# Patient Record
Sex: Female | Born: 1955 | Marital: Single | Smoking: Never smoker
Health system: Southern US, Community
[De-identification: ages and names within clinical notes are randomized; demographics above are authoritative.]

## PROBLEM LIST (undated history)

## (undated) DIAGNOSIS — I10 Essential (primary) hypertension: Secondary | ICD-10-CM

## (undated) DIAGNOSIS — F419 Anxiety disorder, unspecified: Secondary | ICD-10-CM

## (undated) DIAGNOSIS — G43909 Migraine, unspecified, not intractable, without status migrainosus: Secondary | ICD-10-CM

## (undated) DIAGNOSIS — F410 Panic disorder [episodic paroxysmal anxiety] without agoraphobia: Secondary | ICD-10-CM

## (undated) DIAGNOSIS — K851 Biliary acute pancreatitis without necrosis or infection: Secondary | ICD-10-CM

## (undated) DIAGNOSIS — T7840XA Allergy, unspecified, initial encounter: Secondary | ICD-10-CM

## (undated) DIAGNOSIS — K8020 Calculus of gallbladder without cholecystitis without obstruction: Secondary | ICD-10-CM

## (undated) HISTORY — DX: Calculus of gallbladder without cholecystitis without obstruction: K80.20

## (undated) HISTORY — PX: MYRINGOTOMY WITH TUBE PLACEMENT: SHX5663

## (undated) HISTORY — DX: Anxiety disorder, unspecified: F41.9

## (undated) HISTORY — DX: Allergy, unspecified, initial encounter: T78.40XA

## (undated) HISTORY — DX: Biliary acute pancreatitis without necrosis or infection: K85.10

---

## 2007-06-24 ENCOUNTER — Ambulatory Visit: Payer: Self-pay | Admitting: Internal Medicine

## 2007-06-24 DIAGNOSIS — R002 Palpitations: Secondary | ICD-10-CM

## 2007-06-24 DIAGNOSIS — R209 Unspecified disturbances of skin sensation: Secondary | ICD-10-CM | POA: Insufficient documentation

## 2007-06-24 DIAGNOSIS — F4322 Adjustment disorder with anxiety: Secondary | ICD-10-CM

## 2007-06-24 DIAGNOSIS — R609 Edema, unspecified: Secondary | ICD-10-CM

## 2007-06-24 DIAGNOSIS — I83893 Varicose veins of bilateral lower extremities with other complications: Secondary | ICD-10-CM

## 2007-06-25 ENCOUNTER — Ambulatory Visit: Payer: Self-pay | Admitting: Internal Medicine

## 2007-06-25 LAB — CONVERTED CEMR LAB
Blood in Urine, dipstick: NEGATIVE
Nitrite: NEGATIVE
Protein, U semiquant: NEGATIVE
Vit D, 1,25-Dihydroxy: 20 — ABNORMAL LOW (ref 30–89)
WBC Urine, dipstick: NEGATIVE

## 2007-06-26 DIAGNOSIS — J309 Allergic rhinitis, unspecified: Secondary | ICD-10-CM | POA: Insufficient documentation

## 2007-06-30 ENCOUNTER — Telehealth: Payer: Self-pay | Admitting: *Deleted

## 2007-07-01 LAB — CONVERTED CEMR LAB
ALT: 20 units/L (ref 0–35)
AST: 16 units/L (ref 0–37)
Alkaline Phosphatase: 67 units/L (ref 39–117)
Basophils Absolute: 0 10*3/uL (ref 0.0–0.1)
Basophils Relative: 0.5 % (ref 0.0–1.0)
CO2: 34 meq/L — ABNORMAL HIGH (ref 19–32)
Chloride: 106 meq/L (ref 96–112)
Creatinine, Ser: 0.8 mg/dL (ref 0.4–1.2)
Direct LDL: 155.5 mg/dL
Eosinophils Relative: 3 % (ref 0.0–5.0)
HDL: 41.9 mg/dL (ref >39.0)
Lymphocytes Relative: 26.8 % (ref 12.0–46.0)
MCHC: 32.9 g/dL (ref 30.0–36.0)
Neutrophils Relative %: 59.3 % (ref 43.0–77.0)
Platelets: 304 10*3/uL (ref 150–400)
Potassium: 3.8 meq/L (ref 3.5–5.1)
RBC: 5.06 M/uL (ref 3.87–5.11)
T3, Free: 3.2 pg/mL (ref 2.3–4.2)
TSH: 2.14 microintl units/mL (ref 0.35–5.50)
Total Bilirubin: 0.9 mg/dL (ref 0.3–1.2)
VLDL: 23 mg/dL (ref 0–40)
WBC: 4.9 10*3/uL (ref 4.5–10.5)

## 2007-07-16 ENCOUNTER — Ambulatory Visit: Payer: Self-pay | Admitting: Internal Medicine

## 2007-07-16 DIAGNOSIS — E559 Vitamin D deficiency, unspecified: Secondary | ICD-10-CM | POA: Insufficient documentation

## 2007-07-23 ENCOUNTER — Encounter: Payer: Self-pay | Admitting: Internal Medicine

## 2007-08-22 ENCOUNTER — Ambulatory Visit: Payer: Self-pay | Admitting: Internal Medicine

## 2007-08-22 DIAGNOSIS — L309 Dermatitis, unspecified: Secondary | ICD-10-CM | POA: Insufficient documentation

## 2013-09-22 ENCOUNTER — Inpatient Hospital Stay (HOSPITAL_COMMUNITY)
Admission: EM | Admit: 2013-09-22 | Discharge: 2013-09-28 | DRG: 417 | Disposition: A | Discharge status: Home / Self Care | Payer: 59 | Attending: Internal Medicine | Admitting: Internal Medicine

## 2013-09-22 ENCOUNTER — Emergency Department (HOSPITAL_COMMUNITY): Payer: 59

## 2013-09-22 ENCOUNTER — Encounter (HOSPITAL_COMMUNITY): Payer: Self-pay | Admitting: Emergency Medicine

## 2013-09-22 DIAGNOSIS — L503 Dermatographic urticaria: Secondary | ICD-10-CM | POA: Diagnosis present

## 2013-09-22 DIAGNOSIS — Z6838 Body mass index (BMI) 38.0-38.9, adult: Secondary | ICD-10-CM

## 2013-09-22 DIAGNOSIS — K8020 Calculus of gallbladder without cholecystitis without obstruction: Secondary | ICD-10-CM

## 2013-09-22 DIAGNOSIS — K851 Biliary acute pancreatitis without necrosis or infection: Secondary | ICD-10-CM

## 2013-09-22 DIAGNOSIS — K429 Umbilical hernia without obstruction or gangrene: Secondary | ICD-10-CM | POA: Diagnosis present

## 2013-09-22 DIAGNOSIS — Z79899 Other long term (current) drug therapy: Secondary | ICD-10-CM

## 2013-09-22 DIAGNOSIS — K8010 Calculus of gallbladder with chronic cholecystitis without obstruction: Secondary | ICD-10-CM | POA: Diagnosis present

## 2013-09-22 DIAGNOSIS — I10 Essential (primary) hypertension: Secondary | ICD-10-CM | POA: Diagnosis present

## 2013-09-22 DIAGNOSIS — K859 Acute pancreatitis without necrosis or infection, unspecified: Secondary | ICD-10-CM | POA: Diagnosis present

## 2013-09-22 DIAGNOSIS — Z9109 Other allergy status, other than to drugs and biological substances: Secondary | ICD-10-CM | POA: Diagnosis not present

## 2013-09-22 DIAGNOSIS — Z791 Long term (current) use of non-steroidal anti-inflammatories (NSAID): Secondary | ICD-10-CM | POA: Diagnosis not present

## 2013-09-22 DIAGNOSIS — R1013 Epigastric pain: Secondary | ICD-10-CM | POA: Diagnosis present

## 2013-09-22 HISTORY — DX: Biliary acute pancreatitis without necrosis or infection: K85.10

## 2013-09-22 HISTORY — DX: Migraine, unspecified, not intractable, without status migrainosus: G43.909

## 2013-09-22 HISTORY — DX: Calculus of gallbladder without cholecystitis without obstruction: K80.20

## 2013-09-22 LAB — COMPREHENSIVE METABOLIC PANEL
ALT: 187 U/L — ABNORMAL HIGH (ref 0–35)
ANION GAP: 13 (ref 5–15)
AST: 209 U/L — ABNORMAL HIGH (ref 0–37)
Albumin: 3.9 g/dL (ref 3.5–5.2)
Alkaline Phosphatase: 92 U/L (ref 39–117)
BUN: 16 mg/dL (ref 6–23)
CO2: 26 mEq/L (ref 19–32)
CREATININE: 0.84 mg/dL (ref 0.50–1.10)
Calcium: 9.7 mg/dL (ref 8.4–10.5)
Chloride: 101 mEq/L (ref 96–112)
GFR calc Af Amer: 86 mL/min — ABNORMAL LOW (ref >90)
GFR calc non Af Amer: 75 mL/min — ABNORMAL LOW (ref >90)
GLUCOSE: 146 mg/dL — AB (ref 70–99)
Potassium: 4.5 mEq/L (ref 3.7–5.3)
Sodium: 140 mEq/L (ref 137–147)
TOTAL PROTEIN: 7.6 g/dL (ref 6.0–8.3)
Total Bilirubin: 1.3 mg/dL — ABNORMAL HIGH (ref 0.3–1.2)

## 2013-09-22 LAB — CBC WITH DIFFERENTIAL/PLATELET
BASOS PCT: 0 % (ref 0–1)
Basophils Absolute: 0 10*3/uL (ref 0.0–0.1)
EOS ABS: 0 10*3/uL (ref 0.0–0.7)
EOS PCT: 0 % (ref 0–5)
HCT: 46.9 % — ABNORMAL HIGH (ref 36.0–46.0)
HEMOGLOBIN: 16.2 g/dL — AB (ref 12.0–15.0)
LYMPHS ABS: 0.5 10*3/uL — AB (ref 0.7–4.0)
Lymphocytes Relative: 5 % — ABNORMAL LOW (ref 12–46)
MCH: 30 pg (ref 26.0–34.0)
MCHC: 34.5 g/dL (ref 30.0–36.0)
MCV: 86.9 fL (ref 78.0–100.0)
MONO ABS: 0.5 10*3/uL (ref 0.1–1.0)
Monocytes Relative: 5 % (ref 3–12)
Neutro Abs: 8.4 10*3/uL — ABNORMAL HIGH (ref 1.7–7.7)
Neutrophils Relative %: 90 % — ABNORMAL HIGH (ref 43–77)
Platelets: 272 10*3/uL (ref 150–400)
RBC: 5.4 MIL/uL — AB (ref 3.87–5.11)
RDW: 13.1 % (ref 11.5–15.5)
WBC: 9.4 10*3/uL (ref 4.0–10.5)

## 2013-09-22 LAB — I-STAT TROPONIN, ED: Troponin i, poc: 0 ng/mL (ref 0.00–0.08)

## 2013-09-22 LAB — LIPASE, BLOOD

## 2013-09-22 MED ORDER — SODIUM CHLORIDE 0.9 % IV BOLUS (SEPSIS)
1000.0000 mL | Freq: Once | INTRAVENOUS | Status: AC
  Administered 2013-09-22: 1000 mL via INTRAVENOUS

## 2013-09-22 MED ORDER — KETOROLAC TROMETHAMINE 30 MG/ML IJ SOLN
30.0000 mg | Freq: Once | INTRAMUSCULAR | Status: AC
  Administered 2013-09-22: 30 mg via INTRAVENOUS
  Filled 2013-09-22: qty 1

## 2013-09-22 MED ORDER — MORPHINE SULFATE 4 MG/ML IJ SOLN: 4.0000 mg | INTRAMUSCULAR | Status: DC | PRN

## 2013-09-22 MED ORDER — HYDROMORPHONE HCL PF 1 MG/ML IJ SOLN: 1.0000 mg | INTRAMUSCULAR | Status: AC | PRN

## 2013-09-22 MED ORDER — ONDANSETRON HCL 4 MG/2ML IJ SOLN
4.0000 mg | Freq: Once | INTRAMUSCULAR | Status: AC
  Administered 2013-09-22: 4 mg via INTRAVENOUS
  Filled 2013-09-22: qty 2

## 2013-09-22 NOTE — ED Notes (Signed)
Pt ambulated to the restroom with stand by assistance.

## 2013-09-22 NOTE — ED Notes (Signed)
Pt presents from home, c/o of RUQ abdominal pain 8/10, episode of emesis, states made her feel a little better, denies fevers chills or diarrhea.

## 2013-09-22 NOTE — ED Provider Notes (Signed)
Medical screening examination/treatment/procedure(s) were conducted as a shared visit with non-physician practitioner(s) and myself.  I personally evaluated the patient during the encounter.   EKG Interpretation None      I have performed a physical exam and history on the patient including cardiac, pulmonary, and gi system exam which were significant for epigastric abd tenderness.  Ct scan with gallstones, pancreatitis, lipase >3000.  Pt was admitted.    Mirian MoMatthew Gentry, MD 09/22/13 [phone]

## 2013-09-22 NOTE — ED Provider Notes (Signed)
CSN: [id number]     Arrival date & time 09/22/13  1547 History   First MD Initiated Contact with Patient 09/22/13 1651     Chief Complaint  Patient presents with  . Abdominal Pain  . Emesis     (Consider location/radiation/quality/duration/timing/severity/associated sxs/prior Treatment) Patient is a 58 y.o. female presenting with abdominal pain and vomiting.  Abdominal Pain Associated symptoms: vomiting   Emesis Associated symptoms: abdominal pain    This is a 58 y.o. F with PMH sigificant for migraines, presenting to the ED for abdominal pain, onset last night.  Patient states pain is localized to the epigastrium, described as constant and sharp without radiation. She endorses nausea and vomiting.  States this has been an intermittent issue over the past month, seems worse when eating fatty or greasy foods. She does admit to eating chocolate covered peanuts and [employer] earlier today. Denies any chest pain or shortness of breath. No prior abdominal surgeries. No fever or chills. No urinary symptoms or vaginal complaints. BM normal, twice today.  No melena or hematochezia.  Freely passing flatus.  Patient did take Pepcid prior to arrival without improvement of symptoms.  Denies EtOH.  Past Medical History  Diagnosis Date  . Migraine    History reviewed. No pertinent past surgical history. History reviewed. No pertinent family history. History  Substance Use Topics  . Smoking status: Never Smoker   . Smokeless tobacco: Never Used  . Alcohol Use: No   OB History   Grav Para Term Preterm Abortions TAB SAB Ect Mult Living                 Review of Systems  Gastrointestinal: Positive for vomiting and abdominal pain.      Allergies  Adhesive and Iodine  Home Medications   Prior to Admission medications   Medication Sig Start Date End Date Taking? Authorizing Provider  FAMOTIDINE PO Take 1 tablet by mouth daily as needed (reflux.).   Yes Historical Provider, MD  ibuprofen  (ADVIL,MOTRIN) 200 MG tablet Take 600 mg by mouth every 6 (six) hours as needed (pain.).   Yes Historical Provider, MD   BP 190/90  Pulse 62  Temp(Src) 98.1 F (36.7 C) (Oral)  Resp 20  SpO2 100%  Physical Exam  Nursing note and vitals reviewed. Constitutional: She is oriented to person, place, and time. She appears well-developed and well-nourished. No distress.  HENT:  Head: Normocephalic and atraumatic.  Mouth/Throat: Oropharynx is clear and moist.  Eyes: Conjunctivae and EOM are normal. Pupils are equal, round, and reactive to light.  Neck: Normal range of motion. Neck supple.  Cardiovascular: Normal rate, regular rhythm and normal heart sounds.   Pulmonary/Chest: Effort normal and breath sounds normal. No respiratory distress. She has no wheezes.  Abdominal: Soft. Bowel sounds are normal. There is tenderness in the right upper quadrant and epigastric area. There is no guarding, no CVA tenderness, no tenderness at McBurney's point and negative Murphy's sign.  Abdomen soft, non-distended, tenderness of epigastrium and RUQ without peritoneal signs; negative Murphy's sign  Musculoskeletal: Normal range of motion. She exhibits no edema.  Neurological: She is alert and oriented to person, place, and time.  Skin: Skin is warm and dry. She is not diaphoretic.  Psychiatric: She has a normal mood and affect.    ED Course  Procedures (including critical care time) Labs Review Labs Reviewed  CBC WITH DIFFERENTIAL - Abnormal; Notable for the following:    RBC 5.40 (*)    Hemoglobin  16.2 (*)    HCT 46.9 (*)    Neutrophils Relative % 90 (*)    Neutro Abs 8.4 (*)    Lymphocytes Relative 5 (*)    Lymphs Abs 0.5 (*)    All other components within normal limits  COMPREHENSIVE METABOLIC PANEL - Abnormal; Notable for the following:    Glucose, Bld 146 (*)    ALT 187 (*)    Total Bilirubin 1.3 (*)    GFR calc non Af Amer 75 (*)    GFR calc Af Amer 86 (*)    All other components within  normal limits  LIPASE, BLOOD  I-STAT TROPOININ, ED    Imaging Review Ct Abdomen Pelvis Wo Contrast  09/22/2013   CLINICAL DATA:  Mid abdominal pain, nausea and vomiting. Elevated LFTs.  EXAM: CT ABDOMEN AND PELVIS WITHOUT CONTRAST  TECHNIQUE: Multidetector CT imaging of the abdomen and pelvis was performed following the standard protocol without IV contrast.  COMPARISON:  Abdominal ultrasound performed earlier today at 6:27 p.m.  FINDINGS: The visualized lung bases are clear.  There is mild diffuse soft tissue inflammation about the pancreas, extending about the second segment of the duodenum, with trace free fluid tracking inferiorly along Gerota's fascia bilaterally. Minimal inflammation involves the adjacent gallbladder, though the process appears centered about the pancreas. A 5.0 cm stone is noted within the gallbladder, and there appears to be a somewhat high density Phrygian cap.  Findings are suspicious for acute pancreatitis. There is slightly decreased attenuation involving the proximal body of the pancreas, which could reflect mild devascularization. No pseudocyst formation is seen at this time. The liver and spleen are unremarkable in appearance. The gallbladder is within normal limits. The pancreas and adrenal glands are unremarkable.  Nonspecific perinephric stranding is noted bilaterally. The kidneys are otherwise unremarkable in appearance. The kidneys are unremarkable in appearance. There is no evidence of hydronephrosis. No renal or ureteral stones are seen.  No free fluid is identified. The small bowel is unremarkable in appearance. The stomach is within normal limits. No acute vascular abnormalities are seen. Minimal calcification is seen along the abdominal aorta and its branches.  A tiny umbilical hernia is noted, containing only fat.  The appendix is normal in caliber, without evidence for appendicitis. Minimal diverticulosis is noted along the descending and sigmoid colon, without  evidence of diverticulitis.  The bladder is mildly distended and grossly unremarkable. The uterus is within normal limits. The ovaries are relatively symmetric. No suspicious adnexal masses are seen. No inguinal lymphadenopathy is seen.  No acute osseous abnormalities are identified.  IMPRESSION: 1. Acute pancreatitis noted, with mild diffuse soft tissue inflammation about the pancreas, extending about the second segment of the duodenum, and trace free fluid tracking inferiorly along the anterior aspect of Gerota's fascia on both sides. Slightly decreased attenuation at the proximal body of the pancreas could reflect mild devascularization. No evidence of pseudocyst formation. 2. Minimal inflammation involves the adjacent gallbladder, though the process appears centered about the pancreas. 5.0 cm gallstone noted. 3. Tiny umbilical hernia, containing only fat. 4. Minimal diverticulosis along the descending and sigmoid colon, without evidence of diverticulitis.   Electronically Signed   By: Roanna RaiderJeffery  Chang M.D.   On: 09/22/2013 22:50   [place] Abdomen Complete  09/22/2013   CLINICAL DATA:  Nausea, vomiting, and abdominal pain  EXAM: ULTRASOUND ABDOMEN COMPLETE  COMPARISON:  None.  FINDINGS: Gallbladder:  The gallbladder is adequately distended but the lumen is occupied by multiple stones exhibiting a  wall echo shadow contour. There is mild gallbladder wall thickening to 4.2 mm. There are stones in the gallbladder neck. There is no positive sonographic Murphy's sign.  Common bile duct:  Diameter:  4.3 mm.  Liver:  The liver demonstrates mildly increased echotexture consistent with fatty infiltration. There is no focal mass or ductal dilation.  IVC:  No abnormality visualized.  Pancreas:  The pancreas was obscured by bowel gas.  Spleen:  Size and appearance within normal limits.  Right Kidney:  Length: 11.4 cm. Echogenicity within normal limits. No mass or hydronephrosis visualized.  Left Kidney:  Length: 12.2 cm.  Echogenicity within normal limits. No mass or hydronephrosis visualized.  Abdominal aorta:  Evaluation of the abdominal aorta is limited due to bowel gas and the patient's body habitus.  Other findings:  No ascites is demonstrated.  IMPRESSION: 1. There are multiple gallstones with some impacted in the gallbladder neck. There is mild gallbladder wall thickening but no positive sonographic Murphy's sign. 2. The common bile duct is normal. 3. There are likely fatty infiltrated changes of the liver. 4. The pancreas was obscured by bowel gas.   Electronically Signed   By: David  [place]   On: 09/22/2013 20:03     EKG Interpretation None      MDM   Final diagnoses:  Acute gallstone pancreatitis   58 year old female with epigastric abdominal pain. She is afebrile and nontoxic-appearing, focal tenderness of epigastrium and RUQ without peritoneal signs. She has no prior history of abdominal surgeries.  Lab work was obtained revealing elevated LFTs and bilirubin. Her lipase is greater than 3000.  U/s obtained, multiple gallstones noted but unable to fully visualize pancreas.  CT obtained and is consistent with acute pancreatitis without abscess or pseudocysts. Multiple gallstones again seen, no evidence of choledocholithiasis at present.  Will admit to hospitalist service for further management-- Dr. Welton Flakes, Laureen Abrahams.  Case discussed with general surgery who will see patient in consult.  Garlon Hatchet, PA-C 09/23/13 [phone]

## 2013-09-22 NOTE — H&P (Signed)
[facility] History and Physical  Tiffany Lane ZOX:[id number] DOB: 1955/02/27 DOA: 09/22/2013  Referring physician: Sharilyn Sites, PA-C PCP: Lorretta Harp, MD   Chief Complaint: Abdominal Pain  HPI: Tiffany Lane is a 58 y.o. female presents with abdominal pain. Patient states tha she has noted the pain for about 3 days now. She has had indigestion and epigastric pain. Patient states that she had no fevers noted. She states pain does not appear to radiate anywhere else. She initially tried to take some of her famotidine but this did not seem to help. She states that she has never had any pancreatic issues in the past. Patient states that she does not drink and does not smoke. She did have diarrhea but no blood in her stools.   Review of Systems:  Constitutional:  No weight loss, night sweats, Fevers, chills, fatigue.  HEENT:  No headaches, Difficulty swallowing,Tooth/dental problems,Sore throat,  No sneezing, itching, ear ache, nasal congestion, post nasal drip,  Cardio-vascular:  No chest pain, Orthopnea, PND, swelling in lower extremities, anasarca, dizziness, palpitations  GI:  ++heartburn, ++indigestion, ++abdominal pain, ++nausea, ++vomiting, ++diarrhea Resp:  No shortness of breath with exertion or at rest. No excess mucus, no productive cough, No non-productive cough, No coughing up of blood  Skin:  no rash or lesions.  GU:  no dysuria, change in color of urine, no urgency or frequency.  Musculoskeletal:  No joint pain or swelling. No decreased range of motion.  Psych:  No change in mood or affect. No depression or anxiety.  Past Medical History  Diagnosis Date  . Migraine    History reviewed. No pertinent past surgical history. Social History:  reports that she has never smoked. She has never used smokeless tobacco. She reports that she does not drink alcohol or use illicit drugs.  Allergies  Allergen Reactions  . Adhesive [Tape] Hives  . Iodine Hives     History reviewed. No pertinent family history.   Prior to Admission medications   Medication Sig Start Date End Date Taking? Authorizing Provider  FAMOTIDINE PO Take 1 tablet by mouth daily as needed (reflux.).   Yes Historical Provider, MD  ibuprofen (ADVIL,MOTRIN) 200 MG tablet Take 600 mg by mouth every 6 (six) hours as needed (pain.).   Yes Historical Provider, MD   Physical Exam: Filed Vitals:   09/22/13 1558 09/22/13 1851 09/22/13 2111 09/22/13 2332  BP: 190/90 151/68 153/76 170/81  Pulse: 62 68 82 93  Temp: 98.1 F (36.7 C)     TempSrc: Oral     Resp: 20 16 20 16   SpO2: 100% 96% 95% 96%    Wt Readings from Last 3 Encounters:  08/22/07 101.152 kg (223 lb)  07/16/07 101.606 kg (224 lb)  06/24/07 103.42 kg (228 lb)    General:  Appears calm and comfortable Eyes: PERRL, normal lids, irises & conjunctiva ENT: grossly normal hearing, lips & tongue Neck: no LAD, masses or thyromegaly Cardiovascular: RRR, no m/r/g. No LE edema. Respiratory: CTA bilaterally, no w/r/r. Normal respiratory effort. Abdomen: soft, ++epigastric tenderness no rebound negative murphys Skin: no rash or induration seen on limited exam Musculoskeletal: grossly normal tone BUE/BLE Psychiatric: grossly normal mood and affect, speech fluent and appropriate Neurologic: grossly non-focal.          Labs on Admission:  Basic Metabolic Panel:  Recent Labs Lab 09/22/13 1726  NA 140  K 4.5  CL 101  CO2 26  GLUCOSE 146*  BUN 16  CREATININE 0.84  CALCIUM 9.7   Liver Function Tests:  Recent Labs Lab 09/22/13 1726  AST 209*  ALT 187*  ALKPHOS 92  BILITOT 1.3*  PROT 7.6  ALBUMIN 3.9    Recent Labs Lab 09/22/13 1726  LIPASE >3000*   No results found for this basename: AMMONIA,  in the last 168 hours CBC:  Recent Labs Lab 09/22/13 1726  WBC 9.4  NEUTROABS 8.4*  HGB 16.2*  HCT 46.9*  MCV 86.9  PLT 272   Cardiac Enzymes: No results found for this basename: CKTOTAL, CKMB,  CKMBINDEX, TROPONINI,  in the last 168 hours  BNP (last 3 results) No results found for this basename: PROBNP,  in the last 8760 hours CBG: No results found for this basename: GLUCAP,  in the last 168 hours  Radiological Exams on Admission: Ct Abdomen Pelvis Wo Contrast  09/22/2013   CLINICAL DATA:  Mid abdominal pain, nausea and vomiting. Elevated LFTs.  EXAM: CT ABDOMEN AND PELVIS WITHOUT CONTRAST  TECHNIQUE: Multidetector CT imaging of the abdomen and pelvis was performed following the standard protocol without IV contrast.  COMPARISON:  Abdominal ultrasound performed earlier today at 6:27 p.m.  FINDINGS: The visualized lung bases are clear.  There is mild diffuse soft tissue inflammation about the pancreas, extending about the second segment of the duodenum, with trace free fluid tracking inferiorly along Gerota's fascia bilaterally. Minimal inflammation involves the adjacent gallbladder, though the process appears centered about the pancreas. A 5.0 cm stone is noted within the gallbladder, and there appears to be a somewhat high density Phrygian cap.  Findings are suspicious for acute pancreatitis. There is slightly decreased attenuation involving the proximal body of the pancreas, which could reflect mild devascularization. No pseudocyst formation is seen at this time. The liver and spleen are unremarkable in appearance. The gallbladder is within normal limits. The pancreas and adrenal glands are unremarkable.  Nonspecific perinephric stranding is noted bilaterally. The kidneys are otherwise unremarkable in appearance. The kidneys are unremarkable in appearance. There is no evidence of hydronephrosis. No renal or ureteral stones are seen.  No free fluid is identified. The small bowel is unremarkable in appearance. The stomach is within normal limits. No acute vascular abnormalities are seen. Minimal calcification is seen along the abdominal aorta and its branches.  A tiny umbilical hernia is noted,  containing only fat.  The appendix is normal in caliber, without evidence for appendicitis. Minimal diverticulosis is noted along the descending and sigmoid colon, without evidence of diverticulitis.  The bladder is mildly distended and grossly unremarkable. The uterus is within normal limits. The ovaries are relatively symmetric. No suspicious adnexal masses are seen. No inguinal lymphadenopathy is seen.  No acute osseous abnormalities are identified.  IMPRESSION: 1. Acute pancreatitis noted, with mild diffuse soft tissue inflammation about the pancreas, extending about the second segment of the duodenum, and trace free fluid tracking inferiorly along the anterior aspect of Gerota's fascia on both sides. Slightly decreased attenuation at the proximal body of the pancreas could reflect mild devascularization. No evidence of pseudocyst formation. 2. Minimal inflammation involves the adjacent gallbladder, though the process appears centered about the pancreas. 5.0 cm gallstone noted. 3. Tiny umbilical hernia, containing only fat. 4. Minimal diverticulosis along the descending and sigmoid colon, without evidence of diverticulitis.   Electronically Signed   By: Roanna Raider M.D.   On: 09/22/2013 22:50   US Abdomen Complete  09/22/2013   CLINICAL DATA:  Nausea, vomiting, and abdominal pain  EXAM: ULTRASOUND ABDOMEN COMPLETE  COMPARISON:  None.  FINDINGS: Gallbladder:  The gallbladder is adequately distended but the lumen is occupied by multiple stones exhibiting a wall echo shadow contour. There is mild gallbladder wall thickening to 4.2 mm. There are stones in the gallbladder neck. There is no positive sonographic Murphy's sign.  Common bile duct:  Diameter:  4.3 mm.  Liver:  The liver demonstrates mildly increased echotexture consistent with fatty infiltration. There is no focal mass or ductal dilation.  IVC:  No abnormality visualized.  Pancreas:  The pancreas was obscured by bowel gas.  Spleen:  Size and  appearance within normal limits.  Right Kidney:  Length: 11.4 cm. Echogenicity within normal limits. No mass or hydronephrosis visualized.  Left Kidney:  Length: 12.2 cm. Echogenicity within normal limits. No mass or hydronephrosis visualized.  Abdominal aorta:  Evaluation of the abdominal aorta is limited due to bowel gas and the patient's body habitus.  Other findings:  No ascites is demonstrated.  IMPRESSION: 1. There are multiple gallstones with some impacted in the gallbladder neck. There is mild gallbladder wall thickening but no positive sonographic Murphy's sign. 2. The common bile duct is normal. 3. There are likely fatty infiltrated changes of the liver. 4. The pancreas was obscured by bowel gas.   Electronically Signed   By: David  [place]   On: 09/22/2013 20:03     Assessment/Plan Active Problems:   Acute gallstone pancreatitis   Gallstones   1. Acute Pancreatitis -will rest her gut for now -started on IV fluids -pain control  2. Gallstones -surgery consultation -GI consultation    Code Status: Full Code (must indicate code status--if unknown or must be presumed, indicate so) DVT Prophylaxis:Heparin Family Communication: None (indicate person spoken with, if applicable, with phone number if by telephone) Disposition Plan: Home (indicate anticipated LOS)  Time spent: 60min  [facility] HospitalKHAN,SAADAT A [facility] Pager [phone]  **Disclaimer: This note may have been dictated with voice recognition software. Similar sounding words can inadvertently be transcribed and this note may contain transcription errors which may not have been corrected upon publication of note.**

## 2013-09-22 NOTE — ED Notes (Signed)
Bed: [facility] Expected date:  Expected time:  Means of arrival:  Comments: EMS-abdominal pain 

## 2013-09-23 ENCOUNTER — Encounter (HOSPITAL_COMMUNITY): Payer: Self-pay

## 2013-09-23 DIAGNOSIS — K8020 Calculus of gallbladder without cholecystitis without obstruction: Secondary | ICD-10-CM

## 2013-09-23 DIAGNOSIS — K859 Acute pancreatitis without necrosis or infection, unspecified: Secondary | ICD-10-CM

## 2013-09-23 LAB — LIPASE, BLOOD: Lipase: 2115 U/L — ABNORMAL HIGH (ref 11–59)

## 2013-09-23 LAB — COMPREHENSIVE METABOLIC PANEL
ALT: 130 U/L — ABNORMAL HIGH (ref 0–35)
AST: 85 U/L — ABNORMAL HIGH (ref 0–37)
Albumin: 3.3 g/dL — ABNORMAL LOW (ref 3.5–5.2)
Alkaline Phosphatase: 82 U/L (ref 39–117)
Anion gap: 11 (ref 5–15)
BUN: 14 mg/dL (ref 6–23)
CALCIUM: 8 mg/dL — AB (ref 8.4–10.5)
CHLORIDE: 106 meq/L (ref 96–112)
CO2: 24 mEq/L (ref 19–32)
Creatinine, Ser: 0.78 mg/dL (ref 0.50–1.10)
GFR, EST NON AFRICAN AMERICAN: 90 mL/min — AB (ref >90)
GLUCOSE: 110 mg/dL — AB (ref 70–99)
Potassium: 3.9 mEq/L (ref 3.7–5.3)
SODIUM: 141 meq/L (ref 137–147)
Total Bilirubin: 0.6 mg/dL (ref 0.3–1.2)
Total Protein: 6.4 g/dL (ref 6.0–8.3)

## 2013-09-23 LAB — LIPID PANEL
Cholesterol: 179 mg/dL (ref 0–200)
HDL: 50 mg/dL (ref >39)
LDL CALC: 113 mg/dL — AB (ref 0–99)
TRIGLYCERIDES: 79 mg/dL (ref <150)
Total CHOL/HDL Ratio: 3.6 RATIO
VLDL: 16 mg/dL (ref 0–40)

## 2013-09-23 LAB — CBC
HEMATOCRIT: 43.6 % (ref 36.0–46.0)
Hemoglobin: 14.8 g/dL (ref 12.0–15.0)
MCH: 29.6 pg (ref 26.0–34.0)
MCHC: 33.9 g/dL (ref 30.0–36.0)
MCV: 87.2 fL (ref 78.0–100.0)
PLATELETS: 255 10*3/uL (ref 150–400)
RBC: 5 MIL/uL (ref 3.87–5.11)
RDW: 13.4 % (ref 11.5–15.5)
WBC: 11.5 10*3/uL — ABNORMAL HIGH (ref 4.0–10.5)

## 2013-09-23 LAB — GLUCOSE, CAPILLARY: GLUCOSE-CAPILLARY: 106 mg/dL — AB (ref 70–99)

## 2013-09-23 LAB — TSH: TSH: 1.33 u[IU]/mL (ref 0.350–4.500)

## 2013-09-23 LAB — HEMOGLOBIN A1C
Hgb A1c MFr Bld: 5.8 % — ABNORMAL HIGH (ref <5.7)
Mean Plasma Glucose: 120 mg/dL — ABNORMAL HIGH (ref <117)

## 2013-09-23 MED ORDER — FAMOTIDINE 20 MG PO TABS
20.0000 mg | ORAL_TABLET | Freq: Every day | ORAL | Status: DC
  Administered 2013-09-23 – 2013-09-28 (×6): 20 mg via ORAL
  Filled 2013-09-23 (×6): qty 1

## 2013-09-23 MED ORDER — SODIUM CHLORIDE 0.9 % IV SOLN
INTRAVENOUS | Status: AC
  Administered 2013-09-23: 200 mL/h via INTRAVENOUS
  Administered 2013-09-23 (×2): via INTRAVENOUS

## 2013-09-23 MED ORDER — SODIUM CHLORIDE 0.9 % IV BOLUS (SEPSIS): 1000.0000 mL | INTRAVENOUS | Status: DC | PRN

## 2013-09-23 MED ORDER — MAGNESIUM CITRATE PO SOLN: 1.0000 | Freq: Once | ORAL | Status: AC | PRN

## 2013-09-23 MED ORDER — MORPHINE SULFATE 4 MG/ML IJ SOLN
2.0000 mg | INTRAMUSCULAR | Status: DC | PRN
  Administered 2013-09-23 – 2013-09-26 (×9): 2 mg via INTRAVENOUS
  Filled 2013-09-23 (×10): qty 1

## 2013-09-23 MED ORDER — ACETAMINOPHEN 325 MG PO TABS
650.0000 mg | ORAL_TABLET | Freq: Four times a day (QID) | ORAL | Status: DC | PRN
  Administered 2013-09-23 – 2013-09-26 (×3): 650 mg via ORAL
  Filled 2013-09-23 (×2): qty 2

## 2013-09-23 MED ORDER — BISACODYL 10 MG RE SUPP: 10.0000 mg | Freq: Every day | RECTAL | Status: DC | PRN

## 2013-09-23 MED ORDER — DOCUSATE SODIUM 100 MG PO CAPS
100.0000 mg | ORAL_CAPSULE | Freq: Two times a day (BID) | ORAL | Status: DC
  Administered 2013-09-23 – 2013-09-28 (×11): 100 mg via ORAL
  Filled 2013-09-23 (×12): qty 1

## 2013-09-23 MED ORDER — ALUM & MAG HYDROXIDE-SIMETH 200-200-20 MG/5ML PO SUSP: 30.0000 mL | Freq: Four times a day (QID) | ORAL | Status: DC | PRN

## 2013-09-23 MED ORDER — HYDRALAZINE HCL 20 MG/ML IJ SOLN: 10.0000 mg | Freq: Four times a day (QID) | INTRAMUSCULAR | Status: DC | PRN

## 2013-09-23 MED ORDER — ONDANSETRON HCL 4 MG/2ML IJ SOLN
4.0000 mg | Freq: Four times a day (QID) | INTRAMUSCULAR | Status: DC | PRN
  Administered 2013-09-23 – 2013-09-27 (×4): 4 mg via INTRAVENOUS
  Filled 2013-09-23 (×3): qty 2

## 2013-09-23 MED ORDER — ACETAMINOPHEN 650 MG RE SUPP: 650.0000 mg | Freq: Four times a day (QID) | RECTAL | Status: DC | PRN

## 2013-09-23 MED ORDER — PROMETHAZINE HCL 25 MG/ML IJ SOLN
12.5000 mg | Freq: Four times a day (QID) | INTRAMUSCULAR | Status: DC | PRN
  Filled 2013-09-23: qty 1

## 2013-09-23 MED ORDER — ONDANSETRON HCL 4 MG PO TABS: 4.0000 mg | ORAL_TABLET | Freq: Four times a day (QID) | ORAL | Status: DC | PRN

## 2013-09-23 MED ORDER — POTASSIUM CHLORIDE 2 MEQ/ML IV SOLN
INTRAVENOUS | Status: DC
  Administered 2013-09-23 – 2013-09-26 (×6): via INTRAVENOUS
  Filled 2013-09-23 (×14): qty 1000

## 2013-09-23 MED ORDER — HEPARIN SODIUM (PORCINE) 5000 UNIT/ML IJ SOLN
5000.0000 [IU] | Freq: Three times a day (TID) | INTRAMUSCULAR | Status: DC
  Administered 2013-09-23 – 2013-09-25 (×6): 5000 [IU] via SUBCUTANEOUS
  Filled 2013-09-23 (×10): qty 1

## 2013-09-23 NOTE — Progress Notes (Signed)
Patient Demographics  Tiffany Lane, is a 58 y.o. female, DOB - 02-27-1955, UEA:[id number]  Admit date - 09/22/2013   Admitting Physician Yevonne Pax, MD  Outpatient Primary MD for the patient is Lorretta Harp, MD  LOS - 1   Chief Complaint  Patient presents with  . Abdominal Pain  . Emesis        Subjective:   Tiffany Lane today has, No headache, No chest pain, positive epigastric abdominal pain - No Nausea, No new weakness tingling or numbness, No Cough - SOB.     Assessment & Plan    1. Acute gallstone pancreatitis - Gen. surgery following, improved clinically, continue n.p.o., IV fluids, supportive care, discussed with general surgery in detail no need for MRCP right now. Fasting lipid panel stable. Likely cholecystectomy in the next 2-3 days if pancreatitis resolves as expected.      Code Status: Full  Family Communication: Mother bedside  Disposition Plan: Home   Procedures CT scan abdomen pelvis, right upper quadrant ultrasound   Consults [facility]   Medications  Scheduled Meds: . docusate sodium  100 mg Oral BID  . famotidine  20 mg Oral Daily  . heparin  5,000 Units Subcutaneous 3 times per day   Continuous Infusions: . sodium chloride 200 mL/hr at 09/23/13 1030  . [START ON 09/24/2013] dextrose 5 % and 0.45% NaCl 1,000 mL with potassium chloride 20 mEq infusion     PRN Meds:.acetaminophen, alum & mag hydroxide-simeth, bisacodyl, magnesium citrate, morphine, ondansetron (ZOFRAN) IV, promethazine, sodium chloride  DVT Prophylaxis  Heparin  Lab Results  Component Value Date   PLT 255 09/23/2013    Antibiotics     Anti-infectives   None          Objective:   Filed Vitals:   09/22/13 2111 09/22/13 2332 09/23/13 0100 09/23/13 0522  BP: 153/76 170/81  161/84 155/82  Pulse: 82 93 76 82  Temp:   98.6 F (37 C) 98.9 F (37.2 C)  TempSrc:   Oral Oral  Resp: 20 16 18 18   Weight:    105.235 kg (232 lb)  SpO2: 95% 96% 96% 93%    Wt Readings from Last 3 Encounters:  09/23/13 105.235 kg (232 lb)  08/22/07 101.152 kg (223 lb)  07/16/07 101.606 kg (224 lb)     Intake/Output Summary (Last 24 hours) at 09/23/13 1140 Last data filed at 09/23/13 0526  Gross per 24 hour  Intake      0 ml  Output    400 ml  Net   -400 ml     Physical Exam  Awake Alert, Oriented X 3, No new F.N deficits, Normal affect [place].AT,PERRAL Supple Neck,No JVD, No cervical lymphadenopathy appriciated.  Symmetrical Chest wall movement, Good air movement bilaterally, CTAB RRR,No Gallops,Rubs or new Murmurs, No Parasternal Heave +ve B.Sounds, Abd Soft, positive epigastric tenderness, No organomegaly appriciated, No rebound - guarding or rigidity. No Cyanosis, Clubbing or edema, No new Rash or bruise      Data Review   Micro Results No results found for this or any previous visit (from the past 240 hour(s)).  Radiology Reports Ct Abdomen Pelvis Wo Contrast  09/22/2013   CLINICAL DATA:  Mid abdominal pain, nausea and vomiting.  Elevated LFTs.  EXAM: CT ABDOMEN AND PELVIS WITHOUT CONTRAST  TECHNIQUE: Multidetector CT imaging of the abdomen and pelvis was performed following the standard protocol without IV contrast.  COMPARISON:  Abdominal ultrasound performed earlier today at 6:27 p.m.  FINDINGS: The visualized lung bases are clear.  There is mild diffuse soft tissue inflammation about the pancreas, extending about the second segment of the duodenum, with trace free fluid tracking inferiorly along Gerota's fascia bilaterally. Minimal inflammation involves the adjacent gallbladder, though the process appears centered about the pancreas. A 5.0 cm stone is noted within the gallbladder, and there appears to be a somewhat high density Phrygian cap.  Findings are suspicious  for acute pancreatitis. There is slightly decreased attenuation involving the proximal body of the pancreas, which could reflect mild devascularization. No pseudocyst formation is seen at this time. The liver and spleen are unremarkable in appearance. The gallbladder is within normal limits. The pancreas and adrenal glands are unremarkable.  Nonspecific perinephric stranding is noted bilaterally. The kidneys are otherwise unremarkable in appearance. The kidneys are unremarkable in appearance. There is no evidence of hydronephrosis. No renal or ureteral stones are seen.  No free fluid is identified. The small bowel is unremarkable in appearance. The stomach is within normal limits. No acute vascular abnormalities are seen. Minimal calcification is seen along the abdominal aorta and its branches.  A tiny umbilical hernia is noted, containing only fat.  The appendix is normal in caliber, without evidence for appendicitis. Minimal diverticulosis is noted along the descending and sigmoid colon, without evidence of diverticulitis.  The bladder is mildly distended and grossly unremarkable. The uterus is within normal limits. The ovaries are relatively symmetric. No suspicious adnexal masses are seen. No inguinal lymphadenopathy is seen.  No acute osseous abnormalities are identified.  IMPRESSION: 1. Acute pancreatitis noted, with mild diffuse soft tissue inflammation about the pancreas, extending about the second segment of the duodenum, and trace free fluid tracking inferiorly along the anterior aspect of Gerota's fascia on both sides. Slightly decreased attenuation at the proximal body of the pancreas could reflect mild devascularization. No evidence of pseudocyst formation. 2. Minimal inflammation involves the adjacent gallbladder, though the process appears centered about the pancreas. 5.0 cm gallstone noted. 3. Tiny umbilical hernia, containing only fat. 4. Minimal diverticulosis along the descending and sigmoid  colon, without evidence of diverticulitis.   Electronically Signed   By: Roanna RaiderJeffery  Chang M.D.   On: 09/22/2013 22:50   [place] Abdomen Complete  09/22/2013   CLINICAL DATA:  Nausea, vomiting, and abdominal pain  EXAM: ULTRASOUND ABDOMEN COMPLETE  COMPARISON:  None.  FINDINGS: Gallbladder:  The gallbladder is adequately distended but the lumen is occupied by multiple stones exhibiting a wall echo shadow contour. There is mild gallbladder wall thickening to 4.2 mm. There are stones in the gallbladder neck. There is no positive sonographic Murphy's sign.  Common bile duct:  Diameter:  4.3 mm.  Liver:  The liver demonstrates mildly increased echotexture consistent with fatty infiltration. There is no focal mass or ductal dilation.  IVC:  No abnormality visualized.  Pancreas:  The pancreas was obscured by bowel gas.  Spleen:  Size and appearance within normal limits.  Right Kidney:  Length: 11.4 cm. Echogenicity within normal limits. No mass or hydronephrosis visualized.  Left Kidney:  Length: 12.2 cm. Echogenicity within normal limits. No mass or hydronephrosis visualized.  Abdominal aorta:  Evaluation of the abdominal aorta is limited due to bowel gas and the patient's body habitus.  Other findings:  No ascites is demonstrated.  IMPRESSION: 1. There are multiple gallstones with some impacted in the gallbladder neck. There is mild gallbladder wall thickening but no positive sonographic Murphy's sign. 2. The common bile duct is normal. 3. There are likely fatty infiltrated changes of the liver. 4. The pancreas was obscured by bowel gas.   Electronically Signed   By: David  [place]   On: 09/22/2013 20:03    CBC  Recent Labs Lab 09/22/13 1726 09/23/13 0515  WBC 9.4 11.5*  HGB 16.2* 14.8  HCT 46.9* 43.6  PLT 272 255  MCV 86.9 87.2  MCH 30.0 29.6  MCHC 34.5 33.9  RDW 13.1 13.4  LYMPHSABS 0.5*  --   MONOABS 0.5  --   EOSABS 0.0  --   BASOSABS 0.0  --     Chemistries   Recent Labs Lab 09/22/13 1726  09/23/13 0515  NA 140 141  K 4.5 3.9  CL 101 106  CO2 26 24  GLUCOSE 146* 110*  BUN 16 14  CREATININE 0.84 0.78  CALCIUM 9.7 8.0*  AST 209* 85*  ALT 187* 130*  ALKPHOS 92 82  BILITOT 1.3* 0.6   ------------------------------------------------------------------------------------------------------------------ CrCl is unknown because both a height and weight (above a minimum accepted value) are required for this calculation. ------------------------------------------------------------------------------------------------------------------ No results found for this basename: HGBA1C,  in the last 72 hours ------------------------------------------------------------------------------------------------------------------  Recent Labs  09/23/13 0515  CHOL 179  HDL 50  LDLCALC 113*  TRIG 79  CHOLHDL 3.6   ------------------------------------------------------------------------------------------------------------------  Recent Labs  09/23/13 0515  TSH 1.330   ------------------------------------------------------------------------------------------------------------------ No results found for this basename: VITAMINB12, FOLATE, FERRITIN, TIBC, IRON, RETICCTPCT,  in the last 72 hours  Coagulation profile No results found for this basename: INR, PROTIME,  in the last 168 hours  No results found for this basename: DDIMER,  in the last 72 hours  Cardiac Enzymes No results found for this basename: CK, CKMB, TROPONINI, MYOGLOBIN,  in the last 168 hours ------------------------------------------------------------------------------------------------------------------ No components found with this basename: POCBNP,      Time Spent in minutes  35   Susa Raring K M.D on 09/23/2013 at 11:40 AM  Between 7am to 7pm - Pager - [phone]  After 7pm go to [email or web address] - password [email or web address]  And look for the night coverage person covering for me after hours  [facility] Office  [phone]   **Disclaimer: This note may have been dictated with voice recognition software. Similar sounding words can inadvertently be transcribed and this note may contain transcription errors which may not have been corrected upon publication of note.**

## 2013-09-23 NOTE — Consult Note (Signed)
Tiffany Lane October 25, 1955  [id number].   Requesting MD: Dr. 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 She was at a patient's house yesterday and began having epigastric abdominal pain. She had a bowel movement. She that this may help her pain however her pain persisted. Her pain then became excruciating in the epigastrium radiating across her upper abdomen. She then developed nausea and vomiting. She admits to some chills but no fevers. Due to persistent pain that was not improving, she presented to [facility] emergency department. She was found to have an elevated lipase greater than 3000 along with elevated liver function tests. She had an ultrasound of the abdomen which revealed a 5 cm gallstone lodged in the neck of the gallbladder. She then had a CT scan that revealed pancreatitis. The patient was admitted to the hospital and we were asked to evaluate the patient for further recommendations.  ROS please see history of present illness otherwise all other systems have been reviewed and are negative except for currently having a headache from her narcotic pain medicine. 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 Other Mother   . Other Father     Past Medical History  Diagnosis Date  . Migraine     Past Surgical History  Procedure Laterality Date  . Myringotomy with tube placement      Social History:  reports that she has never smoked. She has never used smokeless tobacco. She reports that she does not drink alcohol or use illicit drugs.  Allergies:  Allergies  Allergen Reactions  . Adhesive [Tape] Hives  . Blistex [Phenol]     Blisters lip  . Iodine Hives    Medications Prior to Admission  Medication Sig Dispense Refill   . FAMOTIDINE PO Take 1 tablet by mouth daily as needed (reflux.).      Marland Kitchen ibuprofen (ADVIL,MOTRIN) 200 MG tablet Take 600 mg by mouth every 6 (six) hours as needed (pain.).        Blood pressure 155/82, pulse 82, temperature 98.9 F (37.2 C), temperature source Oral, resp. rate 18, weight 232 lb (105.235 kg), SpO2 93.00%. Physical Exam: General: pleasant, obese white female who is laying in bed in NAD HEENT: head is normocephalic, atraumatic.  Sclera are noninjected.  PERRL.  Ears and nose without any masses or lesions.  Mouth is pink and moist Heart: regular, rate, and rhythm.  Normal s1,s2. No obvious murmurs, gallops, or rubs noted.  Palpable radial and pedal pulses bilaterally Lungs: CTAB, no wheezes, rhonchi, or rales noted.  Respiratory effort nonlabored Abd: soft, tender throughout upper abdomen, but focally more tender in her epigastrium and left upper quadrant. She did have some voluntary guarding with palpation in this area. ND, but obese, +BS, no masses, hernias, or organomegaly MS: all 4 extremities are symmetrical with no cyanosis, clubbing, or edema. Skin: warm and dry with no masses, lesions, or rashes Psych: A&Ox3 with an appropriate affect.    Results for orders placed during the hospital encounter of 09/22/13 (from the past 48 hour(s))  CBC WITH DIFFERENTIAL     Status: Abnormal   Collection Time    09/22/13  5:26 PM      Result Value Ref Range   WBC 9.4  4.0 - 10.5 K/uL  RBC 5.40 (*) 3.87 - 5.11 MIL/uL   Hemoglobin 16.2 (*) 12.0 - 15.0 g/dL   HCT 46.9 (*) 36.0 - 46.0 %   MCV 86.9  78.0 - 100.0 fL   MCH 30.0  26.0 - 34.0 pg   MCHC 34.5  30.0 - 36.0 g/dL   RDW 13.1  11.5 - 15.5 %   Platelets 272  150 - 400 K/uL   Neutrophils Relative % 90 (*) 43 - 77 %   Neutro Abs 8.4 (*) 1.7 - 7.7 K/uL   Lymphocytes Relative 5 (*) 12 - 46 %   Lymphs Abs 0.5 (*) 0.7 - 4.0 K/uL   Monocytes Relative 5  3 - 12 %   Monocytes Absolute 0.5  0.1 - 1.0 K/uL   Eosinophils Relative 0   0 - 5 %   Eosinophils Absolute 0.0  0.0 - 0.7 K/uL   Basophils Relative 0  0 - 1 %   Basophils Absolute 0.0  0.0 - 0.1 K/uL  COMPREHENSIVE METABOLIC PANEL     Status: Abnormal   Collection Time    09/22/13  5:26 PM      Result Value Ref Range   Sodium 140  137 - 147 mEq/L   Potassium 4.5  3.7 - 5.3 mEq/L   Chloride 101  96 - 112 mEq/L   CO2 26  19 - 32 mEq/L   Glucose, Bld 146 (*) 70 - 99 mg/dL   BUN 16  6 - 23 mg/dL   Creatinine, Ser 0.84  0.50 - 1.10 mg/dL   Calcium 9.7  8.4 - 10.5 mg/dL   Total Protein 7.6  6.0 - 8.3 g/dL   Albumin 3.9  3.5 - 5.2 g/dL   AST 209 (*) 0 - 37 U/L   Comment: SLIGHT HEMOLYSIS     HEMOLYSIS AT THIS LEVEL MAY AFFECT RESULT   ALT 187 (*) 0 - 35 U/L   Alkaline Phosphatase 92  39 - 117 U/L   Total Bilirubin 1.3 (*) 0.3 - 1.2 mg/dL   GFR calc non Af Amer 75 (*) >90 mL/min   GFR calc Af Amer 86 (*) >90 mL/min   Comment: (NOTE)     The eGFR has been calculated using the CKD EPI equation.     This calculation has not been validated in all clinical situations.     eGFR's persistently <90 mL/min signify possible Chronic Kidney     Disease.   Anion gap 13  5 - 15  LIPASE, BLOOD     Status: Abnormal   Collection Time    09/22/13  5:26 PM      Result Value Ref Range   Lipase >3000 (*) 11 - 59 U/L  I-STAT TROPOININ, ED     Status: None   Collection Time    09/22/13  5:35 PM      Result Value Ref Range   Troponin i, poc 0.00  0.00 - 0.08 ng/mL   Comment 3            Comment: Due to the release kinetics of cTnI,     a negative result within the first hours     of the onset of symptoms does not rule out     myocardial infarction with certainty.     If myocardial infarction is still suspected,     repeat the test at appropriate intervals.  CBC     Status: Abnormal   Collection Time    09/23/13  5:15  AM      Result Value Ref Range   WBC 11.5 (*) 4.0 - 10.5 K/uL   RBC 5.00  3.87 - 5.11 MIL/uL   Hemoglobin 14.8  12.0 - 15.0 g/dL   HCT 43.6  36.0 - 46.0  %   MCV 87.2  78.0 - 100.0 fL   MCH 29.6  26.0 - 34.0 pg   MCHC 33.9  30.0 - 36.0 g/dL   RDW 13.4  11.5 - 15.5 %   Platelets 255  150 - 400 K/uL  TSH     Status: None   Collection Time    09/23/13  5:15 AM      Result Value Ref Range   TSH 1.330  0.350 - 4.500 uIU/mL   Comment: Performed at [facility] PANEL     Status: Abnormal   Collection Time    09/23/13  5:15 AM      Result Value Ref Range   Sodium 141  137 - 147 mEq/L   Potassium 3.9  3.7 - 5.3 mEq/L   Chloride 106  96 - 112 mEq/L   CO2 24  19 - 32 mEq/L   Glucose, Bld 110 (*) 70 - 99 mg/dL   BUN 14  6 - 23 mg/dL   Creatinine, Ser 0.78  0.50 - 1.10 mg/dL   Calcium 8.0 (*) 8.4 - 10.5 mg/dL   Total Protein 6.4  6.0 - 8.3 g/dL   Albumin 3.3 (*) 3.5 - 5.2 g/dL   AST 85 (*) 0 - 37 U/L   ALT 130 (*) 0 - 35 U/L   Alkaline Phosphatase 82  39 - 117 U/L   Total Bilirubin 0.6  0.3 - 1.2 mg/dL   GFR calc non Af Amer 90 (*) >90 mL/min   GFR calc Af Amer >90  >90 mL/min   Comment: (NOTE)     The eGFR has been calculated using the CKD EPI equation.     This calculation has not been validated in all clinical situations.     eGFR's persistently <90 mL/min signify possible Chronic Kidney     Disease.   Anion gap 11  5 - 15  LIPID PANEL     Status: Abnormal   Collection Time    09/23/13  5:15 AM      Result Value Ref Range   Cholesterol 179  0 - 200 mg/dL   Triglycerides 79  <150 mg/dL   HDL 50  >39 mg/dL   Total CHOL/HDL Ratio 3.6     VLDL 16  0 - 40 mg/dL   LDL Cholesterol 113 (*) 0 - 99 mg/dL   Comment:            Total Cholesterol/HDL:CHD Risk     Coronary Heart Disease Risk Table                         Men   Women      1/2 Average Risk   3.4   3.3      Average Risk       5.0   4.4      2 X Average Risk   9.6   7.1      3 X Average Risk  23.4   11.0                Use the calculated Patient Ratio     above and the CHD Risk Table  to determine the patient's CHD Risk.                 ATP III CLASSIFICATION (LDL):      <100     mg/dL   Optimal      100-129  mg/dL   Near or Above                        Optimal      130-159  mg/dL   Borderline      160-189  mg/dL   High      >190     mg/dL   Very High     Performed at [facility] Abdomen Pelvis Wo Contrast  09/22/2013   CLINICAL DATA:  Mid abdominal pain, nausea and vomiting. Elevated LFTs.  EXAM: CT ABDOMEN AND PELVIS WITHOUT CONTRAST  TECHNIQUE: Multidetector CT imaging of the abdomen and pelvis was performed following the standard protocol without IV contrast.  COMPARISON:  Abdominal ultrasound performed earlier today at 6:27 p.m.  FINDINGS: The visualized lung bases are clear.  There is mild diffuse soft tissue inflammation about the pancreas, extending about the second segment of the duodenum, with trace free fluid tracking inferiorly along Gerota's fascia bilaterally. Minimal inflammation involves the adjacent gallbladder, though the process appears centered about the pancreas. A 5.0 cm stone is noted within the gallbladder, and there appears to be a somewhat high density Phrygian cap.  Findings are suspicious for acute pancreatitis. There is slightly decreased attenuation involving the proximal body of the pancreas, which could reflect mild devascularization. No pseudocyst formation is seen at this time. The liver and spleen are unremarkable in appearance. The gallbladder is within normal limits. The pancreas and adrenal glands are unremarkable.  Nonspecific perinephric stranding is noted bilaterally. The kidneys are otherwise unremarkable in appearance. The kidneys are unremarkable in appearance. There is no evidence of hydronephrosis. No renal or ureteral stones are seen.  No free fluid is identified. The small bowel is unremarkable in appearance. The stomach is within normal limits. No acute vascular abnormalities are seen. Minimal calcification is seen along the abdominal aorta and its branches.  A tiny umbilical  hernia is noted, containing only fat.  The appendix is normal in caliber, without evidence for appendicitis. Minimal diverticulosis is noted along the descending and sigmoid colon, without evidence of diverticulitis.  The bladder is mildly distended and grossly unremarkable. The uterus is within normal limits. The ovaries are relatively symmetric. No suspicious adnexal masses are seen. No inguinal lymphadenopathy is seen.  No acute osseous abnormalities are identified.  IMPRESSION: 1. Acute pancreatitis noted, with mild diffuse soft tissue inflammation about the pancreas, extending about the second segment of the duodenum, and trace free fluid tracking inferiorly along the anterior aspect of Gerota's fascia on both sides. Slightly decreased attenuation at the proximal body of the pancreas could reflect mild devascularization. No evidence of pseudocyst formation. 2. Minimal inflammation involves the adjacent gallbladder, though the process appears centered about the pancreas. 5.0 cm gallstone noted. 3. Tiny umbilical hernia, containing only fat. 4. Minimal diverticulosis along the descending and sigmoid colon, without evidence of diverticulitis.   Electronically Signed   By: Garald Balding M.D.   On: 09/22/2013 22:50   US Abdomen Complete  09/22/2013   CLINICAL DATA:  Nausea, vomiting, and abdominal pain  EXAM: ULTRASOUND ABDOMEN COMPLETE  COMPARISON:  None.  FINDINGS: Gallbladder:  The gallbladder is adequately distended but the  lumen is occupied by multiple stones exhibiting a wall echo shadow contour. There is mild gallbladder wall thickening to 4.2 mm. There are stones in the gallbladder neck. There is no positive sonographic Murphy's sign.  Common bile duct:  Diameter:  4.3 mm.  Liver:  The liver demonstrates mildly increased echotexture consistent with fatty infiltration. There is no focal mass or ductal dilation.  IVC:  No abnormality visualized.  Pancreas:  The pancreas was obscured by bowel gas.  Spleen:   Size and appearance within normal limits.  Right Kidney:  Length: 11.4 cm. Echogenicity within normal limits. No mass or hydronephrosis visualized.  Left Kidney:  Length: 12.2 cm. Echogenicity within normal limits. No mass or hydronephrosis visualized.  Abdominal aorta:  Evaluation of the abdominal aorta is limited due to bowel gas and the patient's body habitus.  Other findings:  No ascites is demonstrated.  IMPRESSION: 1. There are multiple gallstones with some impacted in the gallbladder neck. There is mild gallbladder wall thickening but no positive sonographic Murphy's sign. 2. The common bile duct is normal. 3. There are likely fatty infiltrated changes of the liver. 4. The pancreas was obscured by bowel gas.   Electronically Signed   By: Allanah Mcfarland  [place]   On: 09/22/2013 20:03       Assessment/Plan 1. Gallstone pancreatitis 2. Improving liver function tests  Plan: 1. The patient should continue on n.p.o. status. I will actually increased her IV fluids to 200 cc an hour as this has been shown to help improve pancreatitis faster. This usually helps for the first 24 hours. Therefore I will decrease her IV fluids back down to 125 cc an hour after 12 midnight tonight. 2. Recheck her liver function tests and her lipase in the morning. We will not pursue a cholecystectomy until the patient has clinically resolved her pancreatitis. I have discussed this with the patient. She understands and is agreeable to this plan. We'll continue to follow along with you.  Varden,KELLY E 09/23/2013, 10:06 AM Pager: [phone]  Agree with above. Agree with increased IVF volume.  Clinical course will dictate next steps.  Alphonsa Overall, MD, [facility] Pager: [phone] Office phone:  [phone]

## 2013-09-24 LAB — COMPREHENSIVE METABOLIC PANEL
ALK PHOS: 76 U/L (ref 39–117)
ALT: 74 U/L — AB (ref 0–35)
ANION GAP: 10 (ref 5–15)
AST: 34 U/L (ref 0–37)
Albumin: 2.9 g/dL — ABNORMAL LOW (ref 3.5–5.2)
BILIRUBIN TOTAL: 0.6 mg/dL (ref 0.3–1.2)
BUN: 14 mg/dL (ref 6–23)
CO2: 26 mEq/L (ref 19–32)
Calcium: 7.5 mg/dL — ABNORMAL LOW (ref 8.4–10.5)
Chloride: 106 mEq/L (ref 96–112)
Creatinine, Ser: 0.81 mg/dL (ref 0.50–1.10)
GFR calc Af Amer: >90 mL/min (ref >90)
GFR calc non Af Amer: 78 mL/min — ABNORMAL LOW (ref >90)
Glucose, Bld: 108 mg/dL — ABNORMAL HIGH (ref 70–99)
POTASSIUM: 3.6 meq/L — AB (ref 3.7–5.3)
Sodium: 142 mEq/L (ref 137–147)
TOTAL PROTEIN: 6.2 g/dL (ref 6.0–8.3)

## 2013-09-24 LAB — CBC
HCT: 41.3 % (ref 36.0–46.0)
Hemoglobin: 13.5 g/dL (ref 12.0–15.0)
MCH: 29.3 pg (ref 26.0–34.0)
MCHC: 32.7 g/dL (ref 30.0–36.0)
MCV: 89.8 fL (ref 78.0–100.0)
PLATELETS: 225 10*3/uL (ref 150–400)
RBC: 4.6 MIL/uL (ref 3.87–5.11)
RDW: 13.8 % (ref 11.5–15.5)
WBC: 12.2 10*3/uL — AB (ref 4.0–10.5)

## 2013-09-24 LAB — GLUCOSE, CAPILLARY: GLUCOSE-CAPILLARY: 81 mg/dL (ref 70–99)

## 2013-09-24 LAB — LIPASE, BLOOD: Lipase: 823 U/L — ABNORMAL HIGH (ref 11–59)

## 2013-09-24 MED ORDER — DIPHENHYDRAMINE HCL 25 MG PO CAPS
25.0000 mg | ORAL_CAPSULE | Freq: Four times a day (QID) | ORAL | Status: DC | PRN
  Administered 2013-09-24 – 2013-09-28 (×8): 25 mg via ORAL
  Filled 2013-09-24 (×10): qty 1

## 2013-09-24 MED ORDER — METRONIDAZOLE IN NACL 5-0.79 MG/ML-% IV SOLN
500.0000 mg | Freq: Three times a day (TID) | INTRAVENOUS | Status: DC
Start: 2013-09-24 — End: 2013-09-24
  Administered 2013-09-24 (×2): 500 mg via INTRAVENOUS
  Filled 2013-09-24 (×2): qty 100

## 2013-09-24 MED ORDER — SODIUM CHLORIDE 0.9 % IV SOLN
3.0000 g | Freq: Four times a day (QID) | INTRAVENOUS | Status: DC
  Administered 2013-09-24 – 2013-09-25 (×4): 3 g via INTRAVENOUS
  Filled 2013-09-24 (×6): qty 3

## 2013-09-24 MED ORDER — CIPROFLOXACIN IN D5W 400 MG/200ML IV SOLN
400.0000 mg | Freq: Two times a day (BID) | INTRAVENOUS | Status: DC
  Filled 2013-09-24: qty 200

## 2013-09-24 MED ORDER — DEXTROSE 5 % IV SOLN
2.0000 g | INTRAVENOUS | Status: DC
  Administered 2013-09-24: 2 g via INTRAVENOUS
  Filled 2013-09-24: qty 2

## 2013-09-24 NOTE — Progress Notes (Signed)
Notified MD Thedore MinsSingh that patient is experiencing rash from adhesives over her body that are very itchy, order given for benadryl 25 mg po every 6 hours as needed for itching, order entered Stanford BreedBracey, Varvara Legault N RN 09-24-2013 15:54pm

## 2013-09-24 NOTE — Progress Notes (Addendum)
Patient ID: Tiffany Lane, female   DOB: Jul 09, 1955, 58 y.o.   MRN: [id number]    Subjective: Patient feels better but still having some pain. She is hungry.  Objective: Vital signs in last 24 hours: Temp:  [98.6 F (37 C)-99.7 F (37.6 C)] 98.9 F (37.2 C) (08/13 0600) Pulse Rate:  [76-86] 81 (08/13 0600) Resp:  [18] 18 (08/13 0600) BP: (123-140)/(69-75) 137/75 mmHg (08/13 0600) SpO2:  [88 %-95 %] 94 % (08/13 0600) Last BM Date: 09/22/13  Intake/Output from previous day: 08/12 0701 - 08/13 0700 In: 3612.1 [P.O.:60; I.V.:3552.1] Out: 1150 [Urine:1150] Intake/Output this shift:    PE: Abd: Soft, still tender in her epigastrium and left upper quadrant but less than yesterday. Mild right upper quadrant tenderness. Active bowel sounds, nondistended, obese Heart: Regular Lungs: Clear to auscultation bilaterally  Lab Results:   Recent Labs  09/23/13 0515 09/24/13 0505  WBC 11.5* 12.2*  HGB 14.8 13.5  HCT 43.6 41.3  PLT 255 225   BMET  Recent Labs  09/23/13 0515 09/24/13 0505  NA 141 142  K 3.9 3.6*  CL 106 106  CO2 24 26  GLUCOSE 110* 108*  BUN 14 14  CREATININE 0.78 0.81  CALCIUM 8.0* 7.5*   PT/INR No results found for this basename: LABPROT, INR,  in the last 72 hours CMP     Component Value Date/Time   NA 142 09/24/2013 0505   K 3.6* 09/24/2013 0505   CL 106 09/24/2013 0505   CO2 26 09/24/2013 0505   GLUCOSE 108* 09/24/2013 0505   BUN 14 09/24/2013 0505   CREATININE 0.81 09/24/2013 0505   CALCIUM 7.5* 09/24/2013 0505   PROT 6.2 09/24/2013 0505   ALBUMIN 2.9* 09/24/2013 0505   AST 34 09/24/2013 0505   ALT 74* 09/24/2013 0505   ALKPHOS 76 09/24/2013 0505   BILITOT 0.6 09/24/2013 0505   GFRNONAA 78* 09/24/2013 0505   GFRAA >90 09/24/2013 0505   Lipase     Component Value Date/Time   LIPASE 823* 09/24/2013 0505    Studies/Results: Ct Abdomen Pelvis Wo Contrast  09/22/2013   CLINICAL DATA:  Mid abdominal pain, nausea and vomiting. Elevated LFTs.  EXAM: CT  ABDOMEN AND PELVIS WITHOUT CONTRAST  TECHNIQUE: Multidetector CT imaging of the abdomen and pelvis was performed following the standard protocol without IV contrast.  COMPARISON:  Abdominal ultrasound performed earlier today at 6:27 p.m.  FINDINGS: The visualized lung bases are clear.  There is mild diffuse soft tissue inflammation about the pancreas, extending about the second segment of the duodenum, with trace free fluid tracking inferiorly along Gerota's fascia bilaterally. Minimal inflammation involves the adjacent gallbladder, though the process appears centered about the pancreas. A 5.0 cm stone is noted within the gallbladder, and there appears to be a somewhat high density Phrygian cap.  Findings are suspicious for acute pancreatitis. There is slightly decreased attenuation involving the proximal body of the pancreas, which could reflect mild devascularization. No pseudocyst formation is seen at this time. The liver and spleen are unremarkable in appearance. The gallbladder is within normal limits. The pancreas and adrenal glands are unremarkable.  Nonspecific perinephric stranding is noted bilaterally. The kidneys are otherwise unremarkable in appearance. The kidneys are unremarkable in appearance. There is no evidence of hydronephrosis. No renal or ureteral stones are seen.  No free fluid is identified. The small bowel is unremarkable in appearance. The stomach is within normal limits. No acute vascular abnormalities are seen. Minimal calcification is seen along  the abdominal aorta and its branches.  A tiny umbilical hernia is noted, containing only fat.  The appendix is normal in caliber, without evidence for appendicitis. Minimal diverticulosis is noted along the descending and sigmoid colon, without evidence of diverticulitis.  The bladder is mildly distended and grossly unremarkable. The uterus is within normal limits. The ovaries are relatively symmetric. No suspicious adnexal masses are seen. No  inguinal lymphadenopathy is seen.  No acute osseous abnormalities are identified.  IMPRESSION: 1. Acute pancreatitis noted, with mild diffuse soft tissue inflammation about the pancreas, extending about the second segment of the duodenum, and trace free fluid tracking inferiorly along the anterior aspect of Gerota's fascia on both sides. Slightly decreased attenuation at the proximal body of the pancreas could reflect mild devascularization. No evidence of pseudocyst formation. 2. Minimal inflammation involves the adjacent gallbladder, though the process appears centered about the pancreas. 5.0 cm gallstone noted. 3. Tiny umbilical hernia, containing only fat. 4. Minimal diverticulosis along the descending and sigmoid colon, without evidence of diverticulitis.   Electronically Signed   By: Roanna RaiderJeffery  Chang M.D.   On: 09/22/2013 22:50   [place] Abdomen Complete  09/22/2013   CLINICAL DATA:  Nausea, vomiting, and abdominal pain  EXAM: ULTRASOUND ABDOMEN COMPLETE  COMPARISON:  None.  FINDINGS: Gallbladder:  The gallbladder is adequately distended but the lumen is occupied by multiple stones exhibiting a wall echo shadow contour. There is mild gallbladder wall thickening to 4.2 mm. There are stones in the gallbladder neck. There is no positive sonographic Murphy's sign.  Common bile duct:  Diameter:  4.3 mm.  Liver:  The liver demonstrates mildly increased echotexture consistent with fatty infiltration. There is no focal mass or ductal dilation.  IVC:  No abnormality visualized.  Pancreas:  The pancreas was obscured by bowel gas.  Spleen:  Size and appearance within normal limits.  Right Kidney:  Length: 11.4 cm. Echogenicity within normal limits. No mass or hydronephrosis visualized.  Left Kidney:  Length: 12.2 cm. Echogenicity within normal limits. No mass or hydronephrosis visualized.  Abdominal aorta:  Evaluation of the abdominal aorta is limited due to bowel gas and the patient's body habitus.  Other findings:  No  ascites is demonstrated.  IMPRESSION: 1. There are multiple gallstones with some impacted in the gallbladder neck. There is mild gallbladder wall thickening but no positive sonographic Murphy's sign. 2. The common bile duct is normal. 3. There are likely fatty infiltrated changes of the liver. 4. The pancreas was obscured by bowel gas.   Electronically Signed   By: Liliann File  [place]   On: 09/22/2013 20:03    Anti-infectives: Anti-infectives   Start     Dose/Rate Route Frequency Ordered Stop   09/24/13 1000  cefTRIAXone (ROCEPHIN) 2 g in dextrose 5 % 50 mL IVPB     2 g 100 mL/hr over 30 Minutes Intravenous Every 24 hours 09/24/13 0750     09/24/13 0800  metroNIDAZOLE (FLAGYL) IVPB 500 mg     500 mg 100 mL/hr over 60 Minutes Intravenous Every 8 hours 09/24/13 0743     09/24/13 0745  ciprofloxacin (CIPRO) IVPB 400 mg  Status:  Discontinued     400 mg 200 mL/hr over 60 Minutes Intravenous Every 12 hours 09/24/13 0711 09/24/13 0750       Assessment/Plan  1. Gallstone pancreatitis  Plan: 1. Continue to await pancreatitis to resolve. Once this resolves then we can proceed with surgical intervention. The patient's white blood cell count is mildly trending  up. This likely is secondary to an inflammatory response from her pancreatitis, but she did have some mild wall thickening of her gallbladder on her ultrasound. Therefore I started her on Rocephin. Follow labs in the morning.   LOS: 2 days    Ezzell,KELLY E 09/24/2013, 9:12 AM Pager: [phone]  Agree with above. Lipase coming down.  She is feeling better, though still sore. Her son is sleeping in the room.  Ovidio Kin, MD, [facility] Pager: [phone] Office phone:  [phone]

## 2013-09-24 NOTE — Progress Notes (Signed)
Pt c/o of itchy reddened area on R posterior upper arm, R lower abdomen, and R anterior thigh.  Reported that she had noticed some itching earlier and had scratched.  Now she noticed rash.  Pt's skin inspected by nurses Chassidy Layson and Staton and pt reported she had had adhesive (tape, electrodes) on these areas prior to arrival on 5W.  Pt was given adhesive remover and ice to apply.  Pt reported that she thought symptoms were arising from stress. Noted pt had been on phone frequently and had been making arrangements for the care of her 60 yo and 60 yo sons.   Will follow up.

## 2013-09-24 NOTE — Progress Notes (Signed)
ANTIBIOTIC CONSULT NOTE - INITIAL  Pharmacy Consult for Unasyn Indication: intrabdominal infection  Allergies  Allergen Reactions  . Adhesive [Tape] Hives  . Blistex [Phenol]     Blisters lip  . Iodine Hives  . Lip Balm [Lip Medex] Hives    Allergic to Carmex    Patient Measurements: Height: 5' 5.5" (166.4 cm) Weight: 232 lb (105.235 kg) IBW/kg (Calculated) : 58.15  Vital Signs: Temp: 98.9 F (37.2 C) (08/13 1352) Temp src: Oral (08/13 1352) BP: 134/72 mmHg (08/13 1352) Pulse Rate: 83 (08/13 1352) Intake/Output from previous day: 08/12 0701 - 08/13 0700 In: 3612.1 [P.O.:60; I.V.:3552.1] Out: 1150 [Urine:1150] Intake/Output from this shift: Total I/O In: 1000 [I.V.:850; IV Piggyback:150] Out: 750 [Urine:750]  Labs:  Recent Labs  09/22/13 1726 09/23/13 0515 09/24/13 0505  WBC 9.4 11.5* 12.2*  HGB 16.2* 14.8 13.5  PLT 272 255 225  CREATININE 0.84 0.78 0.81   Estimated Creatinine Clearance: 90.9 ml/min (by C-G formula based on Cr of 0.81). No results found for this basename: VANCOTROUGH, VANCOPEAK, VANCORANDOM, GENTTROUGH, GENTPEAK, GENTRANDOM, TOBRATROUGH, TOBRAPEAK, TOBRARND, AMIKACINPEAK, AMIKACINTROU, AMIKACIN,  in the last 72 hours   Microbiology: No results found for this or any previous visit (from the past 720 hour(s)).  Medical History: Past Medical History  Diagnosis Date  . Migraine     Medications:  Scheduled:  . docusate sodium  100 mg Oral BID  . famotidine  20 mg Oral Daily  . heparin  5,000 Units Subcutaneous 3 times per day   Infusions:  . dextrose 5 % and 0.45% NaCl 1,000 mL with potassium chloride 20 mEq infusion 125 mL/hr at 09/24/13 1000   Assessment: 58 yo female admitted with abdominal pain and emesis and found to have acute gallstone pancreatitis to start Unasyn per pharmacy dosing. Baseline labs: WBC 12.2, SCr 0.81 with est CrCl 91 ml/min and afebrile  Goal of Therapy:  treatment of infection  Plan:  Unasyn 3g IV q6 Will  sign off  Hessie KnowsJustin M Czarina Gingras, PharmD, BCPS Pager [phone] 09/24/2013 3:59 PM

## 2013-09-24 NOTE — Progress Notes (Signed)
Patient Demographics  Tiffany Lane, is a 58 y.o. female, DOB - 1955/04/18, JYN:[id number]  Admit date - 09/22/2013   Admitting Physician Yevonne Pax, MD  Outpatient Primary MD for the patient is Lorretta Harp, MD  LOS - 2   Chief Complaint  Patient presents with  . Abdominal Pain  . Emesis        Subjective:   Tiffany Lane today has, No headache, No chest pain, positive epigastric abdominal pain but much improved - No Nausea, No new weakness tingling or numbness, No Cough - SOB.     Assessment & Plan    1. Acute gallstone pancreatitis with gallbladder thickening - Gen. surgery following, improved clinically, continue n.p.o., IV fluids, supportive care, discussed with general surgery in detail no need for MRCP right now. Fasting lipid panel stable. Likely cholecystectomy in the next 2-3 days if pancreatitis resolves as expected. Has noted Rocephin added by general surgery or gallbladder thickening, have added Flagyl as well.     Code Status: Full  Family Communication: Mother bedside  Disposition Plan: Home   Procedures CT scan abdomen pelvis, right upper quadrant ultrasound   Consults [facility]   Medications  Scheduled Meds: . cefTRIAXone (ROCEPHIN)  IV  2 g Intravenous Q24H  . docusate sodium  100 mg Oral BID  . famotidine  20 mg Oral Daily  . heparin  5,000 Units Subcutaneous 3 times per day  . metronidazole  500 mg Intravenous Q8H   Continuous Infusions: . dextrose 5 % and 0.45% NaCl 1,000 mL with potassium chloride 20 mEq infusion 125 mL/hr at 09/24/13 0813   PRN Meds:.acetaminophen, alum & mag hydroxide-simeth, bisacodyl, hydrALAZINE, morphine, ondansetron (ZOFRAN) IV, promethazine, sodium chloride  DVT Prophylaxis  Heparin  Lab Results  Component Value Date   PLT  225 09/24/2013    Antibiotics     Anti-infectives   Start     Dose/Rate Route Frequency Ordered Stop   09/24/13 1000  cefTRIAXone (ROCEPHIN) 2 g in dextrose 5 % 50 mL IVPB     2 g 100 mL/hr over 30 Minutes Intravenous Every 24 hours 09/24/13 0750     09/24/13 0800  metroNIDAZOLE (FLAGYL) IVPB 500 mg     500 mg 100 mL/hr over 60 Minutes Intravenous Every 8 hours 09/24/13 0743     09/24/13 0745  ciprofloxacin (CIPRO) IVPB 400 mg  Status:  Discontinued     400 mg 200 mL/hr over 60 Minutes Intravenous Every 12 hours 09/24/13 0711 09/24/13 0750          Objective:   Filed Vitals:   09/23/13 1400 09/23/13 2138 09/23/13 2147 09/24/13 0600  BP: 140/72 123/69  137/75  Pulse: 80 86  81  Temp: 98.6 F (37 C) 99.7 F (37.6 C)  98.9 F (37.2 C)  TempSrc: Oral Oral  Oral  Resp: 18 18  18   Height:      Weight:      SpO2: 95% 88% 93% 94%    Wt Readings from Last 3 Encounters:  09/23/13 105.235 kg (232 lb)  08/22/07 101.152 kg (223 lb)  07/16/07 101.606 kg (224 lb)     Intake/Output Summary (Last 24 hours) at 09/24/13 1124 Last data filed at 09/24/13 1058  Gross  per 24 hour  Intake 3162.08 ml  Output   1650 ml  Net 1512.08 ml     Physical Exam  Awake Alert, Oriented X 3, No new F.N deficits, Normal affect Tiffany Lane.AT,PERRAL Supple Neck,No JVD, No cervical lymphadenopathy appriciated.  Symmetrical Chest wall movement, Good air movement bilaterally, CTAB RRR,No Gallops,Rubs or new Murmurs, No Parasternal Heave +ve B.Sounds, Abd Soft, positive epigastric tenderness, No organomegaly appriciated, No rebound - guarding or rigidity. No Cyanosis, Clubbing or edema, No new Rash or bruise      Data Review   Micro Results No results found for this or any previous visit (from the past 240 hour(s)).  Radiology Reports Ct Abdomen Pelvis Wo Contrast  09/22/2013   CLINICAL DATA:  Mid abdominal pain, nausea and vomiting. Elevated LFTs.  EXAM: CT ABDOMEN AND PELVIS WITHOUT CONTRAST   TECHNIQUE: Multidetector CT imaging of the abdomen and pelvis was performed following the standard protocol without IV contrast.  COMPARISON:  Abdominal ultrasound performed earlier today at 6:27 p.m.  FINDINGS: The visualized lung bases are clear.  There is mild diffuse soft tissue inflammation about the pancreas, extending about the second segment of the duodenum, with trace free fluid tracking inferiorly along Gerota's fascia bilaterally. Minimal inflammation involves the adjacent gallbladder, though the process appears centered about the pancreas. A 5.0 cm stone is noted within the gallbladder, and there appears to be a somewhat high density Phrygian cap.  Findings are suspicious for acute pancreatitis. There is slightly decreased attenuation involving the proximal body of the pancreas, which could reflect mild devascularization. No pseudocyst formation is seen at this time. The liver and spleen are unremarkable in appearance. The gallbladder is within normal limits. The pancreas and adrenal glands are unremarkable.  Nonspecific perinephric stranding is noted bilaterally. The kidneys are otherwise unremarkable in appearance. The kidneys are unremarkable in appearance. There is no evidence of hydronephrosis. No renal or ureteral stones are seen.  No free fluid is identified. The small bowel is unremarkable in appearance. The stomach is within normal limits. No acute vascular abnormalities are seen. Minimal calcification is seen along the abdominal aorta and its branches.  A tiny umbilical hernia is noted, containing only fat.  The appendix is normal in caliber, without evidence for appendicitis. Minimal diverticulosis is noted along the descending and sigmoid colon, without evidence of diverticulitis.  The bladder is mildly distended and grossly unremarkable. The uterus is within normal limits. The ovaries are relatively symmetric. No suspicious adnexal masses are seen. No inguinal lymphadenopathy is seen.  No  acute osseous abnormalities are identified.  IMPRESSION: 1. Acute pancreatitis noted, with mild diffuse soft tissue inflammation about the pancreas, extending about the second segment of the duodenum, and trace free fluid tracking inferiorly along the anterior aspect of Gerota's fascia on both sides. Slightly decreased attenuation at the proximal body of the pancreas could reflect mild devascularization. No evidence of pseudocyst formation. 2. Minimal inflammation involves the adjacent gallbladder, though the process appears centered about the pancreas. 5.0 cm gallstone noted. 3. Tiny umbilical hernia, containing only fat. 4. Minimal diverticulosis along the descending and sigmoid colon, without evidence of diverticulitis.   Electronically Signed   By: Roanna Raider M.D.   On: 09/22/2013 22:50   US Abdomen Complete  09/22/2013   CLINICAL DATA:  Nausea, vomiting, and abdominal pain  EXAM: ULTRASOUND ABDOMEN COMPLETE  COMPARISON:  None.  FINDINGS: Gallbladder:  The gallbladder is adequately distended but the lumen is occupied by multiple stones exhibiting a wall  echo shadow contour. There is mild gallbladder wall thickening to 4.2 mm. There are stones in the gallbladder neck. There is no positive sonographic Murphy's sign.  Common bile duct:  Diameter:  4.3 mm.  Liver:  The liver demonstrates mildly increased echotexture consistent with fatty infiltration. There is no focal mass or ductal dilation.  IVC:  No abnormality visualized.  Pancreas:  The pancreas was obscured by bowel gas.  Spleen:  Size and appearance within normal limits.  Right Kidney:  Length: 11.4 cm. Echogenicity within normal limits. No mass or hydronephrosis visualized.  Left Kidney:  Length: 12.2 cm. Echogenicity within normal limits. No mass or hydronephrosis visualized.  Abdominal aorta:  Evaluation of the abdominal aorta is limited due to bowel gas and the patient's body habitus.  Other findings:  No ascites is demonstrated.  IMPRESSION: 1.  There are multiple gallstones with some impacted in the gallbladder neck. There is mild gallbladder wall thickening but no positive sonographic Murphy's sign. 2. The common bile duct is normal. 3. There are likely fatty infiltrated changes of the liver. 4. The pancreas was obscured by bowel gas.   Electronically Signed   By: David  [place]   On: 09/22/2013 20:03    CBC  Recent Labs Lab 09/22/13 1726 09/23/13 0515 09/24/13 0505  WBC 9.4 11.5* 12.2*  HGB 16.2* 14.8 13.5  HCT 46.9* 43.6 41.3  PLT 272 255 225  MCV 86.9 87.2 89.8  MCH 30.0 29.6 29.3  MCHC 34.5 33.9 32.7  RDW 13.1 13.4 13.8  LYMPHSABS 0.5*  --   --   MONOABS 0.5  --   --   EOSABS 0.0  --   --   BASOSABS 0.0  --   --     Chemistries   Recent Labs Lab 09/22/13 1726 09/23/13 0515 09/24/13 0505  NA 140 141 142  K 4.5 3.9 3.6*  CL 101 106 106  CO2 26 24 26   GLUCOSE 146* 110* 108*  BUN 16 14 14   CREATININE 0.84 0.78 0.81  CALCIUM 9.7 8.0* 7.5*  AST 209* 85* 34  ALT 187* 130* 74*  ALKPHOS 92 82 76  BILITOT 1.3* 0.6 0.6   ------------------------------------------------------------------------------------------------------------------ estimated creatinine clearance is 90.9 ml/min (by C-G formula based on Cr of 0.81). ------------------------------------------------------------------------------------------------------------------  Recent Labs  09/23/13 0515  HGBA1C 5.8*   ------------------------------------------------------------------------------------------------------------------  Recent Labs  09/23/13 0515  CHOL 179  HDL 50  LDLCALC 113*  TRIG 79  CHOLHDL 3.6   ------------------------------------------------------------------------------------------------------------------  Recent Labs  09/23/13 0515  TSH 1.330   ------------------------------------------------------------------------------------------------------------------ No results found for this basename: VITAMINB12, FOLATE,  FERRITIN, TIBC, IRON, RETICCTPCT,  in the last 72 hours  Coagulation profile No results found for this basename: INR, PROTIME,  in the last 168 hours  No results found for this basename: DDIMER,  in the last 72 hours  Cardiac Enzymes No results found for this basename: CK, CKMB, TROPONINI, MYOGLOBIN,  in the last 168 hours ------------------------------------------------------------------------------------------------------------------ No components found with this basename: POCBNP,      Time Spent in minutes  35   Susa RaringSINGH,Nissa Stannard K M.D on 09/24/2013 at 11:24 AM  Between 7am to 7pm - Pager - [phone]  After 7pm go to [email or web address] - password [email or web address]  And look for the night coverage person covering for me after hours  [facility] Office  [phone]   **Disclaimer: This note may have been dictated with voice recognition software. Similar sounding words can inadvertently be transcribed and this note may contain  transcription errors which may not have been corrected upon publication of note.**

## 2013-09-24 NOTE — Progress Notes (Signed)
Pt c/o of reoccurence of itching on R thigh (noted pt scratching when RN arrived).  Said rash was spreading to posterior thigh.  Discussed with nurse Staton who also observed.  Contacted PA Barnetta ChapelKelly Appleton to report s/s.  She requested nurse contact Dr. Thedore MinsSingh.  Text page was sent now.

## 2013-09-24 NOTE — Progress Notes (Signed)
Spoke with pt regarding rash and itching.  Pt reported the ice had relieved her symptoms and did not need additional ice at this time.  Noted no change in R posterior upper arm rash, improvement in R abdominal rash (only square of previous EKG leads visible), and no change in leg rash.  Pt repeated that she thought rash was stress related stating that she has trouble with skin sensitivity, but is not allergic to latex.

## 2013-09-25 ENCOUNTER — Encounter (HOSPITAL_COMMUNITY): Payer: 59 | Admitting: Certified Registered Nurse Anesthetist

## 2013-09-25 ENCOUNTER — Inpatient Hospital Stay (HOSPITAL_COMMUNITY): Payer: 59

## 2013-09-25 ENCOUNTER — Inpatient Hospital Stay (HOSPITAL_COMMUNITY): Payer: 59 | Admitting: Certified Registered Nurse Anesthetist

## 2013-09-25 ENCOUNTER — Encounter (HOSPITAL_COMMUNITY)
Admission: EM | Disposition: A | Discharge status: Home / Self Care | Payer: Self-pay | Source: Home / Self Care | Attending: Internal Medicine

## 2013-09-25 DIAGNOSIS — K8010 Calculus of gallbladder with chronic cholecystitis without obstruction: Secondary | ICD-10-CM

## 2013-09-25 HISTORY — PX: CHOLECYSTECTOMY: SHX55

## 2013-09-25 LAB — CBC
HCT: 43.5 % (ref 36.0–46.0)
HEMOGLOBIN: 13.9 g/dL (ref 12.0–15.0)
MCH: 29 pg (ref 26.0–34.0)
MCHC: 32 g/dL (ref 30.0–36.0)
MCV: 90.6 fL (ref 78.0–100.0)
Platelets: 237 10*3/uL (ref 150–400)
RBC: 4.8 MIL/uL (ref 3.87–5.11)
RDW: 13.6 % (ref 11.5–15.5)
WBC: 11.3 10*3/uL — AB (ref 4.0–10.5)

## 2013-09-25 LAB — COMPREHENSIVE METABOLIC PANEL
ALBUMIN: 2.9 g/dL — AB (ref 3.5–5.2)
ALT: 51 U/L — ABNORMAL HIGH (ref 0–35)
AST: 22 U/L (ref 0–37)
Alkaline Phosphatase: 81 U/L (ref 39–117)
Anion gap: 13 (ref 5–15)
BUN: 10 mg/dL (ref 6–23)
CALCIUM: 8.2 mg/dL — AB (ref 8.4–10.5)
CO2: 25 mEq/L (ref 19–32)
Chloride: 98 mEq/L (ref 96–112)
Creatinine, Ser: 0.77 mg/dL (ref 0.50–1.10)
GFR calc Af Amer: >90 mL/min (ref >90)
GFR calc non Af Amer: >90 mL/min (ref >90)
Glucose, Bld: 108 mg/dL — ABNORMAL HIGH (ref 70–99)
Potassium: 3.7 mEq/L (ref 3.7–5.3)
SODIUM: 136 meq/L — AB (ref 137–147)
Total Bilirubin: 0.5 mg/dL (ref 0.3–1.2)
Total Protein: 6.5 g/dL (ref 6.0–8.3)

## 2013-09-25 LAB — LIPASE, BLOOD: Lipase: 199 U/L — ABNORMAL HIGH (ref 11–59)

## 2013-09-25 LAB — SURGICAL PCR SCREEN
MRSA, PCR: NEGATIVE
STAPHYLOCOCCUS AUREUS: POSITIVE — AB

## 2013-09-25 LAB — MAGNESIUM: MAGNESIUM: 2.2 mg/dL (ref 1.5–2.5)

## 2013-09-25 SURGERY — LAPAROSCOPIC CHOLECYSTECTOMY WITH INTRAOPERATIVE CHOLANGIOGRAM
Anesthesia: General

## 2013-09-25 MED ORDER — CISATRACURIUM BESYLATE (PF) 10 MG/5ML IV SOLN
INTRAVENOUS | Status: DC | PRN
  Administered 2013-09-25: 8 mg via INTRAVENOUS
  Administered 2013-09-25: 2 mg via INTRAVENOUS

## 2013-09-25 MED ORDER — SUCCINYLCHOLINE CHLORIDE 20 MG/ML IJ SOLN
INTRAMUSCULAR | Status: DC | PRN
  Administered 2013-09-25: 120 mg via INTRAVENOUS

## 2013-09-25 MED ORDER — BUPIVACAINE HCL (PF) 0.25 % IJ SOLN
INTRAMUSCULAR | Status: AC
  Filled 2013-09-25: qty 30

## 2013-09-25 MED ORDER — LACTATED RINGERS IV SOLN: INTRAVENOUS | Status: DC

## 2013-09-25 MED ORDER — PROPOFOL 10 MG/ML IV BOLUS
INTRAVENOUS | Status: AC
  Filled 2013-09-25: qty 40

## 2013-09-25 MED ORDER — NEOSTIGMINE METHYLSULFATE 10 MG/10ML IV SOLN
INTRAVENOUS | Status: AC
  Filled 2013-09-25: qty 1

## 2013-09-25 MED ORDER — FAMOTIDINE IN NACL 20-0.9 MG/50ML-% IV SOLN
20.0000 mg | Freq: Once | INTRAVENOUS | Status: AC
  Administered 2013-09-25: 20 mg via INTRAVENOUS
  Filled 2013-09-25: qty 50

## 2013-09-25 MED ORDER — DIATRIZOATE MEGLUMINE 30 % UR SOLN
URETHRAL | Status: DC | PRN
  Administered 2013-09-25: 5 mL

## 2013-09-25 MED ORDER — LACTATED RINGERS IV SOLN
INTRAVENOUS | Status: DC
  Administered 2013-09-25: 1000 mL via INTRAVENOUS

## 2013-09-25 MED ORDER — MORPHINE SULFATE 10 MG/ML IJ SOLN
1.0000 mg | INTRAMUSCULAR | Status: DC | PRN
Start: 2013-09-25 — End: 2013-09-28
  Administered 2013-09-25: 2 mg via INTRAVENOUS

## 2013-09-25 MED ORDER — HYDROCODONE-ACETAMINOPHEN 5-325 MG PO TABS
1.0000 | ORAL_TABLET | ORAL | Status: DC | PRN
  Administered 2013-09-26 – 2013-09-28 (×9): 1 via ORAL
  Filled 2013-09-25 (×3): qty 1
  Filled 2013-09-25: qty 2
  Filled 2013-09-25 (×2): qty 1
  Filled 2013-09-25: qty 2
  Filled 2013-09-25 (×2): qty 1

## 2013-09-25 MED ORDER — FENTANYL CITRATE 0.05 MG/ML IJ SOLN
INTRAMUSCULAR | Status: AC
  Filled 2013-09-25: qty 5

## 2013-09-25 MED ORDER — MIDAZOLAM HCL 5 MG/5ML IJ SOLN
INTRAMUSCULAR | Status: DC | PRN
  Administered 2013-09-25 (×2): 1 mg via INTRAVENOUS

## 2013-09-25 MED ORDER — CISATRACURIUM BESYLATE 20 MG/10ML IV SOLN
INTRAVENOUS | Status: AC
  Filled 2013-09-25: qty 10

## 2013-09-25 MED ORDER — MIDAZOLAM HCL 2 MG/2ML IJ SOLN
INTRAMUSCULAR | Status: AC
  Filled 2013-09-25: qty 2

## 2013-09-25 MED ORDER — SODIUM CHLORIDE 0.9 % IJ SOLN
INTRAMUSCULAR | Status: AC
  Filled 2013-09-25: qty 10

## 2013-09-25 MED ORDER — NEOSTIGMINE METHYLSULFATE 10 MG/10ML IV SOLN
INTRAVENOUS | Status: DC | PRN
  Administered 2013-09-25: 1 mg via INTRAVENOUS

## 2013-09-25 MED ORDER — HEPARIN SODIUM (PORCINE) 5000 UNIT/ML IJ SOLN
5000.0000 [IU] | Freq: Three times a day (TID) | INTRAMUSCULAR | Status: DC
  Administered 2013-09-25 – 2013-09-28 (×8): 5000 [IU] via SUBCUTANEOUS
  Filled 2013-09-25 (×12): qty 1

## 2013-09-25 MED ORDER — HYDROMORPHONE HCL PF 1 MG/ML IJ SOLN: 0.2500 mg | INTRAMUSCULAR | Status: DC | PRN

## 2013-09-25 MED ORDER — LACTATED RINGERS IR SOLN
Status: DC | PRN
  Administered 2013-09-25: 100 mL
  Administered 2013-09-25: 300 mL

## 2013-09-25 MED ORDER — PROPOFOL 10 MG/ML IV BOLUS
INTRAVENOUS | Status: DC | PRN
  Administered 2013-09-25: 200 mg via INTRAVENOUS
  Administered 2013-09-25 (×2): 25 mg via INTRAVENOUS

## 2013-09-25 MED ORDER — FENTANYL CITRATE 0.05 MG/ML IJ SOLN
INTRAMUSCULAR | Status: DC | PRN
  Administered 2013-09-25 (×8): 50 ug via INTRAVENOUS
  Administered 2013-09-25: 100 ug via INTRAVENOUS

## 2013-09-25 MED ORDER — LIDOCAINE HCL (CARDIAC) 20 MG/ML IV SOLN
INTRAVENOUS | Status: AC
  Filled 2013-09-25: qty 5

## 2013-09-25 MED ORDER — DIPHENHYDRAMINE HCL 50 MG/ML IJ SOLN
25.0000 mg | Freq: Once | INTRAMUSCULAR | Status: DC
  Filled 2013-09-25: qty 1

## 2013-09-25 MED ORDER — LACTATED RINGERS IV SOLN
INTRAVENOUS | Status: DC | PRN
  Administered 2013-09-25 (×3): via INTRAVENOUS

## 2013-09-25 MED ORDER — GLYCOPYRROLATE 0.2 MG/ML IJ SOLN
INTRAMUSCULAR | Status: DC | PRN
  Administered 2013-09-25: .2 mg via INTRAVENOUS

## 2013-09-25 MED ORDER — GLYCOPYRROLATE 0.2 MG/ML IJ SOLN
INTRAMUSCULAR | Status: AC
  Filled 2013-09-25: qty 3

## 2013-09-25 MED ORDER — BUPIVACAINE HCL (PF) 0.25 % IJ SOLN
INTRAMUSCULAR | Status: DC | PRN
Start: 2013-09-25 — End: 2013-09-25
  Administered 2013-09-25: 30 mL

## 2013-09-25 MED ORDER — METOCLOPRAMIDE HCL 5 MG/ML IJ SOLN
INTRAMUSCULAR | Status: DC | PRN
  Administered 2013-09-25: 5 mg via INTRAVENOUS

## 2013-09-25 MED ORDER — LIDOCAINE HCL (CARDIAC) 20 MG/ML IV SOLN
INTRAVENOUS | Status: DC | PRN
  Administered 2013-09-25: 75 mg via INTRAVENOUS

## 2013-09-25 MED ORDER — EPHEDRINE SULFATE 50 MG/ML IJ SOLN
INTRAMUSCULAR | Status: AC
  Filled 2013-09-25: qty 1

## 2013-09-25 MED ORDER — ONDANSETRON HCL 4 MG/2ML IJ SOLN
INTRAMUSCULAR | Status: AC
  Filled 2013-09-25: qty 2

## 2013-09-25 MED ORDER — ONDANSETRON HCL 4 MG/2ML IJ SOLN
INTRAMUSCULAR | Status: DC | PRN
  Administered 2013-09-25 (×2): 2 mg via INTRAVENOUS

## 2013-09-25 SURGICAL SUPPLY — 58 items
ADH SKN CLS APL DERMABOND .7 (GAUZE/BANDAGES/DRESSINGS) ×2
APL SKNCLS STERI-STRIP NONHPOA (GAUZE/BANDAGES/DRESSINGS)
APPLIER CLIP 5 13 M/L LIGAMAX5 (MISCELLANEOUS) ×3
APPLIER CLIP ROT 10 11.4 M/L (STAPLE) ×3
APR CLP MED LRG 11.4X10 (STAPLE) ×1
APR CLP MED LRG 5 ANG JAW (MISCELLANEOUS) ×1
BAG SPEC RTRVL 10 TROC 200 (ENDOMECHANICALS) ×1
BAG SPEC RTRVL LRG 6X4 10 (ENDOMECHANICALS) ×1
BENZOIN TINCTURE PRP APPL 2/3 (GAUZE/BANDAGES/DRESSINGS) ×1 IMPLANT
CANISTER SUCTION 2500CC (MISCELLANEOUS) ×1 IMPLANT
CHLORAPREP W/TINT 26ML (MISCELLANEOUS) ×3 IMPLANT
CHOLANGIOGRAM CATH TAUT (CATHETERS) ×3 IMPLANT
CLIP APPLIE 5 13 M/L LIGAMAX5 (MISCELLANEOUS) IMPLANT
CLIP APPLIE ROT 10 11.4 M/L (STAPLE) ×1 IMPLANT
CLOSURE WOUND 1/4X4 (GAUZE/BANDAGES/DRESSINGS)
COVER MAYO STAND STRL (DRAPES) ×2 IMPLANT
DECANTER SPIKE VIAL GLASS SM (MISCELLANEOUS) ×3 IMPLANT
DERMABOND ADVANCED (GAUZE/BANDAGES/DRESSINGS) ×4
DERMABOND ADVANCED .7 DNX12 (GAUZE/BANDAGES/DRESSINGS) IMPLANT
DEVICE TROCAR PUNCTURE CLOSURE (ENDOMECHANICALS) ×2 IMPLANT
DRAPE C-ARM 42X120 X-RAY (DRAPES) ×3 IMPLANT
DRAPE LAPAROSCOPIC ABDOMINAL (DRAPES) ×3 IMPLANT
DRAPE UTILITY XL STRL (DRAPES) ×1 IMPLANT
ELECT REM PT RETURN 9FT ADLT (ELECTROSURGICAL) ×3
ELECTRODE REM PT RTRN 9FT ADLT (ELECTROSURGICAL) ×1 IMPLANT
GLOVE SURG SIGNA 7.5 PF LTX (GLOVE) ×7 IMPLANT
GOWN SPEC L4 XLG W/TWL (GOWN DISPOSABLE) ×3 IMPLANT
GOWN STRL REUS W/ TWL XL LVL3 (GOWN DISPOSABLE) ×3 IMPLANT
GOWN STRL REUS W/TWL XL LVL3 (GOWN DISPOSABLE) ×9
HEMOSTAT SURGICEL 4X8 (HEMOSTASIS) IMPLANT
IV CATH 14GX2 1/4 (CATHETERS) ×3 IMPLANT
IV LACTATED RINGERS 1000ML (IV SOLUTION) ×6 IMPLANT
IV SET MACRO CATH EXT 6 LUER (IV SETS) ×3 IMPLANT
KIT BASIN OR (CUSTOM PROCEDURE TRAY) ×3 IMPLANT
MANIFOLD NEPTUNE II (INSTRUMENTS) ×2 IMPLANT
NS IRRIG 1000ML POUR BTL (IV SOLUTION) IMPLANT
PENCIL BUTTON HOLSTER BLD 10FT (ELECTRODE) ×2 IMPLANT
POUCH RETRIEVAL ECOSAC 10 (ENDOMECHANICALS) ×1 IMPLANT
POUCH RETRIEVAL ECOSAC 10MM (ENDOMECHANICALS) ×2
POUCH SPECIMEN RETRIEVAL 10MM (ENDOMECHANICALS) ×2 IMPLANT
SET IRRIG TUBING LAPAROSCOPIC (IRRIGATION / IRRIGATOR) ×3 IMPLANT
SLEEVE SURGEON STRL (DRAPES) ×2 IMPLANT
SLEEVE XCEL OPT CAN 5 100 (ENDOMECHANICALS) ×3 IMPLANT
SOLUTION ANTI FOG 6CC (MISCELLANEOUS) ×3 IMPLANT
SPONGE LAP 18X18 X RAY DECT (DISPOSABLE) ×2 IMPLANT
STOPCOCK 4 WAY LG BORE MALE ST (IV SETS) ×3 IMPLANT
STRIP CLOSURE SKIN 1/4X4 (GAUZE/BANDAGES/DRESSINGS) IMPLANT
SUT MNCRL AB 4-0 PS2 18 (SUTURE) ×4 IMPLANT
SUT VIC AB 3-0 SH 18 (SUTURE) ×3 IMPLANT
SUT VIC AB 5-0 PS2 18 (SUTURE) ×3 IMPLANT
SUT VICRYL 0 UR6 27IN ABS (SUTURE) ×2 IMPLANT
TOWEL OR 17X26 10 PK STRL BLUE (TOWEL DISPOSABLE) ×3 IMPLANT
TOWEL OR NON WOVEN STRL DISP B (DISPOSABLE) ×3 IMPLANT
TRAY LAP CHOLE (CUSTOM PROCEDURE TRAY) ×3 IMPLANT
TROCAR BLADELESS OPT 5 100 (ENDOMECHANICALS) ×3 IMPLANT
TROCAR XCEL BLUNT TIP 100MML (ENDOMECHANICALS) ×3 IMPLANT
TROCAR XCEL NON-BLD 11X100MML (ENDOMECHANICALS) ×3 IMPLANT
TUBING INSUFFLATION 10FT LAP (TUBING) ×3 IMPLANT

## 2013-09-25 NOTE — Progress Notes (Signed)
Patient Demographics  Tiffany Lane, is a 58 y.o. female, DOB - 10/29/1955, UJW:[id number]  Admit date - 09/22/2013   Admitting Physician Yevonne PaxSaadat A Khan, MD  Outpatient Primary MD for the patient is [name],[name], MD  LOS - 3   Chief Complaint  Patient presents with  . Abdominal Pain  . Emesis        Subjective:   Tiffany GurneyMary Harvel today has, No headache, No chest pain, positive epigastric abdominal pain but much improved - No Nausea, No new weakness tingling or numbness, No Cough - SOB.     Assessment & Plan    1. Acute gallstone pancreatitis with gallbladder thickening - Gen. surgery following, improved clinically, continue n.p.o., IV fluids, supportive care, discussed with general surgery in detail no need for MRCP right now. Stable Fasting lipid panel stable. Lipase and clinically much improved. Likely cholecystectomy soon. On Unasyn now for some gallbladder thickening (monitor rash).    2.Allergic rash - has some dermographism , likely from adhesive tape, removed, IV Pepcid and Benadryl, DC'd Rocpehin & Flagyl. Patient refused steroids.     Code Status: Full  Family Communication: Mother bedside  Disposition Plan: Home   Procedures CT scan abdomen pelvis, right upper quadrant ultrasound   Consults [facility]   Medications  Scheduled Meds: . ampicillin-sulbactam (UNASYN) IV  3 g Intravenous Q6H  . diphenhydrAMINE  25 mg Intravenous Once  . docusate sodium  100 mg Oral BID  . famotidine (PEPCID) IV  20 mg Intravenous Once  . famotidine  20 mg Oral Daily  . heparin  5,000 Units Subcutaneous 3 times per day   Continuous Infusions: . dextrose 5 % and 0.45% NaCl 1,000 mL with potassium chloride 20 mEq infusion 125 mL/hr at 09/25/13 0444   PRN Meds:.acetaminophen, alum & mag  hydroxide-simeth, bisacodyl, diphenhydrAMINE, hydrALAZINE, morphine, ondansetron (ZOFRAN) IV, promethazine, sodium chloride  DVT Prophylaxis  Heparin  Lab Results  Component Value Date   PLT 237 09/25/2013    Antibiotics     Anti-infectives   Start     Dose/Rate Route Frequency Ordered Stop   09/24/13 1700  Ampicillin-Sulbactam (UNASYN) 3 g in sodium chloride 0.9 % 100 mL IVPB     3 g 100 mL/hr over 60 Minutes Intravenous Every 6 hours 09/24/13 1600     09/24/13 1000  cefTRIAXone (ROCEPHIN) 2 g in dextrose 5 % 50 mL IVPB  Status:  Discontinued     2 g 100 mL/hr over 30 Minutes Intravenous Every 24 hours 09/24/13 0750 09/24/13 1555   09/24/13 0800  metroNIDAZOLE (FLAGYL) IVPB 500 mg  Status:  Discontinued     500 mg 100 mL/hr over 60 Minutes Intravenous Every 8 hours 09/24/13 0743 09/24/13 1555   09/24/13 0745  ciprofloxacin (CIPRO) IVPB 400 mg  Status:  Discontinued     400 mg 200 mL/hr over 60 Minutes Intravenous Every 12 hours 09/24/13 0711 09/24/13 0750          Objective:   Filed Vitals:   09/24/13 0600 09/24/13 1352 09/24/13 2200 09/25/13 0613  BP: 137/75 134/72 170/73 131/69  Pulse: 81 83 80 86  Temp: 98.9 F (37.2 C) 98.9 F (37.2 C) 98.7 F (37.1 C) 99 F (37.2 C)  TempSrc: Oral  Oral Oral Oral  Resp: 18 18 18 18   Height:      Weight:      SpO2: 94% 94% 91% 91%    Wt Readings from Last 3 Encounters:  09/23/13 105.235 kg (232 lb)  08/22/07 101.152 kg (223 lb)  07/16/07 101.606 kg (224 lb)     Intake/Output Summary (Last 24 hours) at 09/25/13 0930 Last data filed at 09/25/13 1610  Gross per 24 hour  Intake 2927.08 ml  Output   1100 ml  Net 1827.08 ml     Physical Exam  Awake Alert, Oriented X 3, No new F.N deficits, Normal affect [place].AT,PERRAL Supple Neck,No JVD, No cervical lymphadenopathy appriciated.  Symmetrical Chest wall movement, Good air movement bilaterally, CTAB RRR,No Gallops,Rubs or new Murmurs, No Parasternal Heave +ve B.Sounds,  Abd Soft, positive epigastric tenderness, No organomegaly appriciated, No rebound - guarding or rigidity. No Cyanosis, Clubbing or edema, +ve macular rash inner thighs and trunk area some dermographism from tape site     Data Review   Micro Results No results found for this or any previous visit (from the past 240 hour(s)).  Radiology Reports Ct Abdomen Pelvis Wo Contrast  09/22/2013   CLINICAL DATA:  Mid abdominal pain, nausea and vomiting. Elevated LFTs.  EXAM: CT ABDOMEN AND PELVIS WITHOUT CONTRAST  TECHNIQUE: Multidetector CT imaging of the abdomen and pelvis was performed following the standard protocol without IV contrast.  COMPARISON:  Abdominal ultrasound performed earlier today at 6:27 p.m.  FINDINGS: The visualized lung bases are clear.  There is mild diffuse soft tissue inflammation about the pancreas, extending about the second segment of the duodenum, with trace free fluid tracking inferiorly along Gerota's fascia bilaterally. Minimal inflammation involves the adjacent gallbladder, though the process appears centered about the pancreas. A 5.0 cm stone is noted within the gallbladder, and there appears to be a somewhat high density Phrygian cap.  Findings are suspicious for acute pancreatitis. There is slightly decreased attenuation involving the proximal body of the pancreas, which could reflect mild devascularization. No pseudocyst formation is seen at this time. The liver and spleen are unremarkable in appearance. The gallbladder is within normal limits. The pancreas and adrenal glands are unremarkable.  Nonspecific perinephric stranding is noted bilaterally. The kidneys are otherwise unremarkable in appearance. The kidneys are unremarkable in appearance. There is no evidence of hydronephrosis. No renal or ureteral stones are seen.  No free fluid is identified. The small bowel is unremarkable in appearance. The stomach is within normal limits. No acute vascular abnormalities are seen.  Minimal calcification is seen along the abdominal aorta and its branches.  A tiny umbilical hernia is noted, containing only fat.  The appendix is normal in caliber, without evidence for appendicitis. Minimal diverticulosis is noted along the descending and sigmoid colon, without evidence of diverticulitis.  The bladder is mildly distended and grossly unremarkable. The uterus is within normal limits. The ovaries are relatively symmetric. No suspicious adnexal masses are seen. No inguinal lymphadenopathy is seen.  No acute osseous abnormalities are identified.  IMPRESSION: 1. Acute pancreatitis noted, with mild diffuse soft tissue inflammation about the pancreas, extending about the second segment of the duodenum, and trace free fluid tracking inferiorly along the anterior aspect of Gerota's fascia on both sides. Slightly decreased attenuation at the proximal body of the pancreas could reflect mild devascularization. No evidence of pseudocyst formation. 2. Minimal inflammation involves the adjacent gallbladder, though the process appears centered about the pancreas. 5.0 cm gallstone noted. 3.  Tiny umbilical hernia, containing only fat. 4. Minimal diverticulosis along the descending and sigmoid colon, without evidence of diverticulitis.   Electronically Signed   By: Roanna Raider M.D.   On: 09/22/2013 22:50   US Abdomen Complete  09/22/2013   CLINICAL DATA:  Nausea, vomiting, and abdominal pain  EXAM: ULTRASOUND ABDOMEN COMPLETE  COMPARISON:  None.  FINDINGS: Gallbladder:  The gallbladder is adequately distended but the lumen is occupied by multiple stones exhibiting a wall echo shadow contour. There is mild gallbladder wall thickening to 4.2 mm. There are stones in the gallbladder neck. There is no positive sonographic Murphy's sign.  Common bile duct:  Diameter:  4.3 mm.  Liver:  The liver demonstrates mildly increased echotexture consistent with fatty infiltration. There is no focal mass or ductal dilation.   IVC:  No abnormality visualized.  Pancreas:  The pancreas was obscured by bowel gas.  Spleen:  Size and appearance within normal limits.  Right Kidney:  Length: 11.4 cm. Echogenicity within normal limits. No mass or hydronephrosis visualized.  Left Kidney:  Length: 12.2 cm. Echogenicity within normal limits. No mass or hydronephrosis visualized.  Abdominal aorta:  Evaluation of the abdominal aorta is limited due to bowel gas and the patient's body habitus.  Other findings:  No ascites is demonstrated.  IMPRESSION: 1. There are multiple gallstones with some impacted in the gallbladder neck. There is mild gallbladder wall thickening but no positive sonographic Murphy's sign. 2. The common bile duct is normal. 3. There are likely fatty infiltrated changes of the liver. 4. The pancreas was obscured by bowel gas.   Electronically Signed   By: David  [place]   On: 09/22/2013 20:03    CBC  Recent Labs Lab 09/22/13 1726 09/23/13 0515 09/24/13 0505 09/25/13 0449  WBC 9.4 11.5* 12.2* 11.3*  HGB 16.2* 14.8 13.5 13.9  HCT 46.9* 43.6 41.3 43.5  PLT 272 255 225 237  MCV 86.9 87.2 89.8 90.6  MCH 30.0 29.6 29.3 29.0  MCHC 34.5 33.9 32.7 32.0  RDW 13.1 13.4 13.8 13.6  LYMPHSABS 0.5*  --   --   --   MONOABS 0.5  --   --   --   EOSABS 0.0  --   --   --   BASOSABS 0.0  --   --   --     Chemistries   Recent Labs Lab 09/22/13 1726 09/23/13 0515 09/24/13 0505 09/25/13 0449  NA 140 141 142 136*  K 4.5 3.9 3.6* 3.7  CL 101 106 106 98  CO2 26 24 26 25   GLUCOSE 146* 110* 108* 108*  BUN 16 14 14 10   CREATININE 0.84 0.78 0.81 0.77  CALCIUM 9.7 8.0* 7.5* 8.2*  MG  --   --   --  2.2  AST 209* 85* 34 22  ALT 187* 130* 74* 51*  ALKPHOS 92 82 76 81  BILITOT 1.3* 0.6 0.6 0.5   Lipase     Component Value Date/Time   LIPASE 199* 09/25/2013 0449     ------------------------------------------------------------------------------------------------------------------ estimated creatinine clearance is 92  ml/min (by C-G formula based on Cr of 0.77). ------------------------------------------------------------------------------------------------------------------  Recent Labs  09/23/13 0515  HGBA1C 5.8*   ------------------------------------------------------------------------------------------------------------------  Recent Labs  09/23/13 0515  CHOL 179  HDL 50  LDLCALC 113*  TRIG 79  CHOLHDL 3.6   ------------------------------------------------------------------------------------------------------------------  Recent Labs  09/23/13 0515  TSH 1.330   ------------------------------------------------------------------------------------------------------------------ No results found for this basename: VITAMINB12, FOLATE, FERRITIN, TIBC, IRON, RETICCTPCT,  in the last 72 hours  Coagulation profile No results found for this basename: INR, PROTIME,  in the last 168 hours  No results found for this basename: DDIMER,  in the last 72 hours  Cardiac Enzymes No results found for this basename: CK, CKMB, TROPONINI, MYOGLOBIN,  in the last 168 hours ------------------------------------------------------------------------------------------------------------------ No components found with this basename: POCBNP,      Time Spent in minutes  35   Susa Raring K M.D on 09/25/2013 at 9:30 AM  Between 7am to 7pm - Pager - [phone]  After 7pm go to [email or web address] - password [email or web address]  And look for the night coverage person covering for me after hours  [facility] Office  [phone]   **Disclaimer: This note may have been dictated with voice recognition software. Similar sounding words can inadvertently be transcribed and this note may contain transcription errors which may not have been corrected upon publication of note.**

## 2013-09-25 NOTE — Op Note (Signed)
09/22/2013 - 09/25/2013  5:46 PM  PATIENT:  Tiffany Lane, 58 y.o., female, MRN: [id number]  PREOP DIAGNOSIS:  Cholelithiasis, umbilical hernia, abdominal/back/leg rash  POSTOP DIAGNOSIS:   Chronic cholecystitis, cholelithiasis (5 cm gall stone), umbilical hernia, abdominal/back/leg rash  PROCEDURE:   Procedure(s): LAPAROSCOPIC CHOLECYSTECTOMY WITH INTRAOPERATIVE CHOLANGIOGRAM, REPAIR OF UMBILICAL HERNIA  SURGEON:   Ovidio Kinavid Caryl Fate, M.D.  ASSISTANT:   A. Maisie Fushomas, M.D.  ANESTHESIA:   general  Anesthesiologist: Gaetano Hawthorneharles L Ewell, MD CRNA: Edison PaceJoanne E Gray, CRNA  General  ASA: 3  EBL:  minimal  ml  BLOOD ADMINISTERED: none  DRAINS: none   LOCAL MEDICATIONS USED:   30 cc 1/4% marcaine  SPECIMEN:   Gall bladder with large gall stone  COUNTS CORRECT:  YES  INDICATIONS FOR PROCEDURE:  Tiffany MungoMary K Lane is a 58 y.o. (DOB: 01/16/1956) white  female whose primary care physician is [name],[name], MD and comes for cholecystectomy.  She was admitted on 09/12/2013 with pancreatitis (lipase > 3,000) and gall stones.  She has rapidly improved with minimal abdominal pain and a lipase of 199.  I discussed proceeding with lap chole with her.  She has an umbilical hernia which I may have to repair, depending on the incisions.  She also has developed a rash of her abdomen, back and legs.  The etiology of the rash is unknown.  She has had a reaction where she had EKG leads.   The indications and risks of the gall bladder surgery were explained to the patient.  The risks include, but are not limited to, infection, bleeding, common bile duct injury and open surgery.  SURGERY:  The patient [facility].  The abdomen was prepped with chloroprep.  The patient was on Unasyn.   A time out was held and the surgical checklist run.   An infraumbilical incision was made into the abdominal cavity.  A 12 mm Hasson trocar was inserted into the abdominal cavity through the infraumbilical incision  and secured with a 0 Vicryl suture.  Three additional trocars were inserted: a 10 mm trocar in the sub-xiphoid location, a 5 mm trocar in the right mid subcostal area, and a 5 mm trocar in the right lateral subcostal area.  I had to put an additional 5 mm port between the subxiphoid trocar and umbilicus.   The abdomen was explored and the liver, stomach, and bowel that could be seen were unremarkable.   The gall bladder lay high in the right upper quadrant.  There were some adhesions along about 1/2 of the gall bladder.  I grasped the gall bladder and rotated it cephalad.  She had a cystic artery that came out below the gall bladder cystic duct junction.  Disssection was carried down to the gall bladder/cystic duct junction and the cystic duct isolated.  A clip was placed on the gall bladder side of the cystic duct.   An intra-operative cholangiogram was shot.   The intra-operative cholangiogram was shot using a cut off Taut catheter placed through a 14 gauge angiocath in the RUQ.  The Taut catheter was inserted in the cut cystic duct and secured with an endoclip.  A cholangiogram was shot with 10 cc of 1/2 strength Omnipaque.  I tried to limit the Omnipaque because of a possible iodine allergy, but thought that the exam was important in a patient who had pancreatitis.  Using fluoroscopy, the cholangiogram showed the flow of contrast into the common bile duct, up the  hepatic radicals, and into the duodenum.  There was no mass or obstruction.  This was a normal intra-operative cholangiogram.   The Taut catheter was removed.  The cystic duct was tripley endoclipped and the cystic artery was identified and clipped.  The gall bladder was bluntly and sharpley dissected from the gall bladder bed.   After the gall bladder was removed from the liver, the gall bladder bed and [place] of [place] were inspected.  There was no bleeding or bile leak.  The gall bladder was placed in a endocatch bag and delivered through  the umbilicus.  The gall stone in the gall bladder was 5.0 cm in size.  I could not fracture the gall stone to get the gall bladder out, so I extended the infraumbilical incision into her umbilical hernia.  I then repair my incision and the umbilical hernia with 0 Vicryl.  I irrigated out the umbilical incision with 1,000 cc of NS.  The abdomen was irrigated with 1,500 cc saline.   The trocars were then removed.  I infiltrated 30 cc of 1/4% Marcaine into the incisions.  The umbilical port closed with a 0 Vicryl suture and the skin closed with 5-0 vicryl.  The skin was painted with Dermabond.  The patient's sponge and needle count were correct.  The patient was transported to the RR in good condition.  Ovidio Kin, MD, [facility] Surgery Pager: [phone] Office phone:  [phone]

## 2013-09-25 NOTE — OR Nursing (Signed)
Late entry: additional [facility].

## 2013-09-25 NOTE — Anesthesia Preprocedure Evaluation (Addendum)
Anesthesia Evaluation  Patient identified by MRN, date of birth, ID band Patient awake    Reviewed: Allergy & Precautions, H&P , NPO status , Patient's Chart, lab work & pertinent test results  Airway Mallampati: II TM Distance: >3 FB Neck ROM: full    Dental  (+) Chipped, Dental Advisory Given Small chips upper front teeth and broken lateral tooth:   Pulmonary neg pulmonary ROS,  breath sounds clear to auscultation  Pulmonary exam normal       Cardiovascular Exercise Tolerance: Good negative cardio ROS  Rhythm:regular Rate:Normal  palpitations   Neuro/Psych negative neurological ROS  negative psych ROS   GI/Hepatic negative GI ROS, Neg liver ROS, Gallstone pancreatitis   Endo/Other  negative endocrine ROSMorbid obesity  Renal/GU negative Renal ROS  negative genitourinary   Musculoskeletal   Abdominal (+) + obese,   Peds  Hematology negative hematology ROS (+)   Anesthesia Other Findings   Reproductive/Obstetrics negative OB ROS                          Anesthesia Physical Anesthesia Plan  ASA: III  Anesthesia Plan: General   Post-op Pain Management:    Induction: Intravenous  Airway Management Planned: Oral ETT  Additional Equipment:   Intra-op Plan:   Post-operative Plan: Extubation in OR  Informed Consent: I have reviewed the patients History and Physical, chart, labs and discussed the procedure including the risks, benefits and alternatives for the proposed anesthesia with the patient or authorized representative who has indicated his/her understanding and acceptance.   Dental Advisory Given  Plan Discussed with: CRNA and Surgeon  Anesthesia Plan Comments:         Anesthesia Quick Evaluation

## 2013-09-25 NOTE — Anesthesia Procedure Notes (Signed)
Procedure Name: Intubation Date/Time: 09/25/2013 3:12 PM Performed by: Edison PaceGRAY, Korbyn Vanes E Pre-anesthesia Checklist: Patient identified, Emergency Drugs available, Suction available, Patient being monitored and Timeout performed Patient Re-evaluated:Patient Re-evaluated prior to inductionOxygen Delivery Method: Circle system utilized Preoxygenation: Pre-oxygenation with 100% oxygen Intubation Type: IV induction Ventilation: Mask ventilation without difficulty Laryngoscope Size: Mac and 4 Grade View: Grade II Tube type: Oral Tube size: 7.0 mm Number of attempts: 2 Airway Equipment and Method: Stylet Placement Confirmation: positive ETCO2,  breath sounds checked- equal and bilateral and ETT inserted through vocal cords under direct vision Secured at: 21 cm Tube secured with: Tape (paper tape on face) Dental Injury: Teeth and Oropharynx as per pre-operative assessment

## 2013-09-25 NOTE — Transfer of Care (Signed)
Immediate Anesthesia Transfer of Care Note  Patient: Champ MungoMary K Hatcher  Procedure(s) Performed: Procedure(s): LAPAROSCOPIC CHOLECYSTECTOMY WITH INTRAOPERATIVE CHOLANGIOGRAM, REPAIR OF UMBILICAL HERNIA (N/A)  Patient Location: PACU  Anesthesia Type:General  Level of Consciousness: awake, alert , oriented and patient cooperative  Airway & Oxygen Therapy: Patient Spontanous Breathing and Patient connected to face mask oxygen  Post-op Assessment: Report given to PACU RN, Post -op Vital signs reviewed and stable and Patient moving all extremities  Post vital signs: Reviewed and stable  Complications: No apparent anesthesia complications

## 2013-09-25 NOTE — Progress Notes (Addendum)
Patient ID: Champ MungoMary K Brunke, female   DOB: 10/18/1955, 58 y.o.   MRN: [id number]    Subjective: Pt feels better today  Objective: Vital signs in last 24 hours: Temp:  [98.7 F (37.1 C)-99 F (37.2 C)] 99 F (37.2 C) (08/14 0613) Pulse Rate:  [80-86] 86 (08/14 0613) Resp:  [18] 18 (08/14 0613) BP: (131-170)/(69-73) 131/69 mmHg (08/14 0613) SpO2:  [91 %-94 %] 91 % (08/14 0613) Last BM Date: 09/22/13  Intake/Output from previous day: 08/13 0701 - 08/14 0700 In: 3027.1 [I.V.:2877.1; IV Piggyback:150] Out: 1100 [Urine:1100] Intake/Output this shift: Total I/O In: -  Out: 300 [Urine:300]  PE: Abd: soft, less tender, still minimally in epigastrium, +BS  Lab Results:   Recent Labs  09/24/13 0505 09/25/13 0449  WBC 12.2* 11.3*  HGB 13.5 13.9  HCT 41.3 43.5  PLT 225 237   BMET  Recent Labs  09/24/13 0505 09/25/13 0449  NA 142 136*  K 3.6* 3.7  CL 106 98  CO2 26 25  GLUCOSE 108* 108*  BUN 14 10  CREATININE 0.81 0.77  CALCIUM 7.5* 8.2*   PT/INR No results found for this basename: LABPROT, INR,  in the last 72 hours CMP     Component Value Date/Time   NA 136* 09/25/2013 0449   K 3.7 09/25/2013 0449   CL 98 09/25/2013 0449   CO2 25 09/25/2013 0449   GLUCOSE 108* 09/25/2013 0449   BUN 10 09/25/2013 0449   CREATININE 0.77 09/25/2013 0449   CALCIUM 8.2* 09/25/2013 0449   PROT 6.5 09/25/2013 0449   ALBUMIN 2.9* 09/25/2013 0449   AST 22 09/25/2013 0449   ALT 51* 09/25/2013 0449   ALKPHOS 81 09/25/2013 0449   BILITOT 0.5 09/25/2013 0449   GFRNONAA >90 09/25/2013 0449   GFRAA >90 09/25/2013 0449   Lipase     Component Value Date/Time   LIPASE 199* 09/25/2013 0449       Studies/Results: No results found.  Anti-infectives: Anti-infectives   Start     Dose/Rate Route Frequency Ordered Stop   09/24/13 1700  Ampicillin-Sulbactam (UNASYN) 3 g in sodium chloride 0.9 % 100 mL IVPB     3 g 100 mL/hr over 60 Minutes Intravenous Every 6 hours 09/24/13 1600     09/24/13 1000   cefTRIAXone (ROCEPHIN) 2 g in dextrose 5 % 50 mL IVPB  Status:  Discontinued     2 g 100 mL/hr over 30 Minutes Intravenous Every 24 hours 09/24/13 0750 09/24/13 1555   09/24/13 0800  metroNIDAZOLE (FLAGYL) IVPB 500 mg  Status:  Discontinued     500 mg 100 mL/hr over 60 Minutes Intravenous Every 8 hours 09/24/13 0743 09/24/13 1555   09/24/13 0745  ciprofloxacin (CIPRO) IVPB 400 mg  Status:  Discontinued     400 mg 200 mL/hr over 60 Minutes Intravenous Every 12 hours 09/24/13 0711 09/24/13 0750      Assessment/Plan  1. Gallstone pancreatitis  Plan: 1. Cont NPO.  May be able to try to do lap chole today.  If not, will plan for tomorrow.  D/w patient   LOS: 3 days   Lennox,KELLY E 09/25/2013, 11:17 AM Pager: [phone]  Agree with above. Depending on schedule - will try to do lap chole today. She has a superficial rash of abdomen and arms.  She thinks that she is allergic to the EKG pads.  She also has a possible iodine allergy when this was used on the skin of her arm several years ago.  I discussed with the patient the indications and risks of gall bladder surgery.  The primary risks of gall bladder surgery include, but are not limited to, bleeding, infection, common bile duct injury, and open surgery.  There is also the risk that the patient may have continued symptoms after surgery.  We discussed the typical post-operative recovery course. I tried to answer the patient's questions.   Ovidio Kin, MD, [facility] Surgery Pager: [phone] Office phone:  [phone]

## 2013-09-26 LAB — MAGNESIUM: MAGNESIUM: 2.1 mg/dL (ref 1.5–2.5)

## 2013-09-26 LAB — GLUCOSE, CAPILLARY: Glucose-Capillary: 112 mg/dL — ABNORMAL HIGH (ref 70–99)

## 2013-09-26 LAB — COMPREHENSIVE METABOLIC PANEL
ALT: 40 U/L — AB (ref 0–35)
AST: 25 U/L (ref 0–37)
Albumin: 2.4 g/dL — ABNORMAL LOW (ref 3.5–5.2)
Alkaline Phosphatase: 72 U/L (ref 39–117)
Anion gap: 9 (ref 5–15)
BUN: 9 mg/dL (ref 6–23)
CALCIUM: 8.1 mg/dL — AB (ref 8.4–10.5)
CO2: 27 mEq/L (ref 19–32)
Chloride: 101 mEq/L (ref 96–112)
Creatinine, Ser: 0.76 mg/dL (ref 0.50–1.10)
GFR calc non Af Amer: >90 mL/min (ref >90)
GLUCOSE: 110 mg/dL — AB (ref 70–99)
Potassium: 4.1 mEq/L (ref 3.7–5.3)
Sodium: 137 mEq/L (ref 137–147)
TOTAL PROTEIN: 5.7 g/dL — AB (ref 6.0–8.3)
Total Bilirubin: 0.5 mg/dL (ref 0.3–1.2)

## 2013-09-26 LAB — CBC
HCT: 36.5 % (ref 36.0–46.0)
Hemoglobin: 12.1 g/dL (ref 12.0–15.0)
MCH: 29.6 pg (ref 26.0–34.0)
MCHC: 33.2 g/dL (ref 30.0–36.0)
MCV: 89.2 fL (ref 78.0–100.0)
Platelets: 220 10*3/uL (ref 150–400)
RBC: 4.09 MIL/uL (ref 3.87–5.11)
RDW: 13.5 % (ref 11.5–15.5)
WBC: 8.6 10*3/uL (ref 4.0–10.5)

## 2013-09-26 LAB — LIPASE, BLOOD: Lipase: 73 U/L — ABNORMAL HIGH (ref 11–59)

## 2013-09-26 MED ORDER — CHLORHEXIDINE GLUCONATE CLOTH 2 % EX PADS
6.0000 | MEDICATED_PAD | Freq: Every day | CUTANEOUS | Status: DC
Start: 2013-09-26 — End: 2013-09-28
  Administered 2013-09-27: 6 via TOPICAL

## 2013-09-26 MED ORDER — MUPIROCIN 2 % EX OINT
1.0000 "application " | TOPICAL_OINTMENT | Freq: Two times a day (BID) | CUTANEOUS | Status: DC
  Administered 2013-09-26 – 2013-09-28 (×5): 1 via NASAL
  Filled 2013-09-26: qty 22

## 2013-09-26 MED ORDER — CLOBETASOL PROPIONATE 0.05 % EX CREA
TOPICAL_CREAM | Freq: Two times a day (BID) | CUTANEOUS | Status: DC
  Administered 2013-09-26 – 2013-09-28 (×4): via TOPICAL
  Filled 2013-09-26: qty 15

## 2013-09-26 NOTE — Progress Notes (Signed)
Patient ID: Tiffany Lane, female   DOB: 06/18/1955, 58 y.o.   MRN: [id number] 1 Day Post-Op  Subjective: Very sore at incisions. Not able to get up and get around on her own yet. Abdominal pain she had an admission is resolved. Tolerating clear liquids without nausea. Has had flatus but no bowel movement.  Objective: Vital signs in last 24 hours: Temp:  [97.6 F (36.4 C)-98.9 F (37.2 C)] 98.5 F (36.9 C) (08/15 0550) Pulse Rate:  [62-76] 66 (08/15 0550) Resp:  [15-19] 18 (08/15 0550) BP: (126-157)/(54-76) 157/76 mmHg (08/15 0550) SpO2:  [90 %-100 %] 91 % (08/15 0550) Last BM Date: 09/22/13  Intake/Output from previous day: 08/14 0701 - 08/15 0700 In: 5592.9 [P.O.:120; I.V.:5472.9] Out: 2600 [Urine:2600] Intake/Output this shift:    General appearance: alert, cooperative, no distress and moderately obese GI: normal findings: soft, non-tender Incision/Wound: clean and dry  Lab Results:   Recent Labs  09/25/13 0449 09/26/13 0520  WBC 11.3* 8.6  HGB 13.9 12.1  HCT 43.5 36.5  PLT 237 220   BMET  Recent Labs  09/25/13 0449 09/26/13 0520  NA 136* 137  K 3.7 4.1  CL 98 101  CO2 25 27  GLUCOSE 108* 110*  BUN 10 9  CREATININE 0.77 0.76  CALCIUM 8.2* 8.1*   Lab Results  Component Value Date   ALT 40* 09/26/2013   AST 25 09/26/2013   ALKPHOS 72 09/26/2013   BILITOT 0.5 09/26/2013   Lipase     Component Value Date/Time   LIPASE 73* 09/26/2013 0520     Studies/Results: Dg Cholangiogram Operative  09/25/2013   CLINICAL DATA:  Cholelithiasis.  EXAM: INTRAOPERATIVE CHOLANGIOGRAM  TECHNIQUE: Cholangiographic images from the C-arm fluoroscopic device were submitted for interpretation post-operatively. Please see the procedural report for the amount of contrast and the fluoroscopy time utilized.  COMPARISON:  Abdominal CT 09/22/2013  FINDINGS: Contrast injection demonstrates opacification of the cystic duct, common bile duct and duodenum. No large filling defects or  stones. There is minor filling of the intrahepatic biliary system.  IMPRESSION: Patent biliary system without obstructing stones or lesions.   Electronically Signed   By: Richarda OverlieAdam  Henn M.D.   On: 09/25/2013 16:20    Anti-infectives: Anti-infectives   Start     Dose/Rate Route Frequency Ordered Stop   09/24/13 1700  [MAR Hold]  Ampicillin-Sulbactam (UNASYN) 3 g in sodium chloride 0.9 % 100 mL IVPB  Status:  Discontinued     (On MAR Hold since 09/25/13 1435)   3 g 100 mL/hr over 60 Minutes Intravenous Every 6 hours 09/24/13 1600 09/25/13 1855   09/24/13 1000  cefTRIAXone (ROCEPHIN) 2 g in dextrose 5 % 50 mL IVPB  Status:  Discontinued     2 g 100 mL/hr over 30 Minutes Intravenous Every 24 hours 09/24/13 0750 09/24/13 1555   09/24/13 0800  metroNIDAZOLE (FLAGYL) IVPB 500 mg  Status:  Discontinued     500 mg 100 mL/hr over 60 Minutes Intravenous Every 8 hours 09/24/13 0743 09/24/13 1555   09/24/13 0745  ciprofloxacin (CIPRO) IVPB 400 mg  Status:  Discontinued     400 mg 200 mL/hr over 60 Minutes Intravenous Every 12 hours 09/24/13 0711 09/24/13 0750      Assessment/Plan: s/p Procedure(s): LAPAROSCOPIC CHOLECYSTECTOMY WITH INTRAOPERATIVE CHOLANGIOGRAM, REPAIR OF UMBILICAL HERNIA Doing well but not quite ready for discharge. Advance diet and increased activity encouraged.   LOS: 4 days    Tiffany Lane T 09/26/2013

## 2013-09-26 NOTE — ED Provider Notes (Signed)
Medical screening examination/treatment/procedure(s) were performed by non-physician practitioner and as supervising physician I was immediately available for consultation/collaboration.   EKG Interpretation   Date/Time:  Tuesday September 22 2013 17:20:32 EDT Ventricular Rate:  65 PR Interval:  142 QRS Duration: 93 QT Interval:  432 QTC Calculation: 449 R Axis:   -57 Text Interpretation:  Sinus rhythm Left anterior fascicular block Abnormal  R-wave progression, late transition No previous tracing Confirmed by Tresa EndoKELLY   MD, THOMAS ([id number]) on 09/23/2013 6:40:16 PM        Tiffany MoMatthew Stepan Verrette, MD 09/26/13 1506

## 2013-09-26 NOTE — Progress Notes (Signed)
Patient Demographics  Tiffany Lane, is a 58 y.o. female, DOB - 1955/04/08, ZOX:[id number]  Admit date - 09/22/2013   Admitting Physician Yevonne Pax, MD  Outpatient Primary MD for the patient is Lorretta Harp, MD  LOS - 4   Chief Complaint  Patient presents with  . Abdominal Pain  . Emesis        Subjective:   Tiffany Lane today has, No headache, No chest pain, positive epigastric abdominal pain but much improved - No Nausea, No new weakness tingling or numbness, No Cough - SOB.     Assessment & Plan    1. Acute gallstone pancreatitis with gallbladder thickening - Gen. surgery following, improved clinically, status post laparoscopic cholecystectomy on 09/25/2013, antibiotic stopped, diet advanced. Surgery monitoring. She is much better pancreatitis resolved.   2.Allergic rash - has some dermographism , likely from adhesive tape, tape removed, IV Pepcid and Benadryl, DC'd ABX, she refused IV or oral steroids but is agreeable to topical which we will apply it to one area and monitor.     Code Status: Full  Family Communication: Mother bedside  Disposition Plan: Home   Procedures CT scan abdomen pelvis, right upper quadrant ultrasound   Consults [facility]   Medications  Scheduled Meds: . Chlorhexidine Gluconate Cloth  6 each Topical Daily  . clobetasol cream   Topical BID  . diphenhydrAMINE  25 mg Intravenous Once  . docusate sodium  100 mg Oral BID  . famotidine  20 mg Oral Daily  . heparin  5,000 Units Subcutaneous 3 times per day  . mupirocin ointment  1 application Nasal BID   Continuous Infusions: . dextrose 5 % and 0.45% NaCl 1,000 mL with potassium chloride 20 mEq infusion 125 mL/hr at 09/25/13 2256   PRN Meds:.acetaminophen, alum & mag hydroxide-simeth, bisacodyl,  diphenhydrAMINE, hydrALAZINE, HYDROcodone-acetaminophen, morphine, morphine injection, ondansetron (ZOFRAN) IV, promethazine, sodium chloride  DVT Prophylaxis  Heparin  Lab Results  Component Value Date   PLT 220 09/26/2013    Antibiotics     Anti-infectives   Start     Dose/Rate Route Frequency Ordered Stop   09/24/13 1700  [MAR Hold]  Ampicillin-Sulbactam (UNASYN) 3 g in sodium chloride 0.9 % 100 mL IVPB  Status:  Discontinued     (On MAR Hold since 09/25/13 1435)   3 g 100 mL/hr over 60 Minutes Intravenous Every 6 hours 09/24/13 1600 09/25/13 1855   09/24/13 1000  cefTRIAXone (ROCEPHIN) 2 g in dextrose 5 % 50 mL IVPB  Status:  Discontinued     2 g 100 mL/hr over 30 Minutes Intravenous Every 24 hours 09/24/13 0750 09/24/13 1555   09/24/13 0800  metroNIDAZOLE (FLAGYL) IVPB 500 mg  Status:  Discontinued     500 mg 100 mL/hr over 60 Minutes Intravenous Every 8 hours 09/24/13 0743 09/24/13 1555   09/24/13 0745  ciprofloxacin (CIPRO) IVPB 400 mg  Status:  Discontinued     400 mg 200 mL/hr over 60 Minutes Intravenous Every 12 hours 09/24/13 0711 09/24/13 0750          Objective:   Filed Vitals:   09/25/13 2100 09/25/13 2200 09/26/13 0253 09/26/13 0550  BP: 145/70 153/76 148/65 157/76  Pulse: 62 66 69 66  Temp:  98 F (36.7 C) 98.6 F (37 C) 98.7 F (37.1 C) 98.5 F (36.9 C)  TempSrc: Oral Oral Oral Oral  Resp: 18 18 18 18   Height:      Weight:      SpO2: 94% 95% 92% 91%    Wt Readings from Last 3 Encounters:  09/23/13 105.235 kg (232 lb)  09/23/13 105.235 kg (232 lb)  08/22/07 101.152 kg (223 lb)     Intake/Output Summary (Last 24 hours) at 09/26/13 1126 Last data filed at 09/26/13 1000  Gross per 24 hour  Intake 6212.92 ml  Output   2800 ml  Net 3412.92 ml     Physical Exam  Awake Alert, Oriented X 3, No new F.N deficits, Normal affect [place].AT,PERRAL Supple Neck,No JVD, No cervical lymphadenopathy appriciated.  Symmetrical Chest wall movement, Good air  movement bilaterally, CTAB RRR,No Gallops,Rubs or new Murmurs, No Parasternal Heave +ve B.Sounds, Abd Soft, positive epigastric tenderness, No organomegaly appriciated, No rebound - guarding or rigidity. No Cyanosis, Clubbing or edema, +ve macular rash inner thighs and trunk area some dermographism from tape site     Data Review   Micro Results Recent Results (from the past 240 hour(s))  SURGICAL PCR SCREEN     Status: Abnormal   Collection Time    09/25/13  1:16 PM      Result Value Ref Range Status   MRSA, PCR NEGATIVE  NEGATIVE Final   Staphylococcus aureus POSITIVE (*) NEGATIVE Final   Comment:            The Xpert SA Assay (FDA     approved for NASAL specimens     in patients over 60 years of age),     is one component of     a comprehensive surveillance     program.  Test performance has     been validated by [facility] for patients greater     than or equal to 60 year old.     It is not intended     to diagnose infection nor to     guide or monitor treatment.    Radiology Reports Ct Abdomen Pelvis Wo Contrast  09/22/2013   CLINICAL DATA:  Mid abdominal pain, nausea and vomiting. Elevated LFTs.  EXAM: CT ABDOMEN AND PELVIS WITHOUT CONTRAST  TECHNIQUE: Multidetector CT imaging of the abdomen and pelvis was performed following the standard protocol without IV contrast.  COMPARISON:  Abdominal ultrasound performed earlier today at 6:27 p.m.  FINDINGS: The visualized lung bases are clear.  There is mild diffuse soft tissue inflammation about the pancreas, extending about the second segment of the duodenum, with trace free fluid tracking inferiorly along Gerota's fascia bilaterally. Minimal inflammation involves the adjacent gallbladder, though the process appears centered about the pancreas. A 5.0 cm stone is noted within the gallbladder, and there appears to be a somewhat high density Phrygian cap.  Findings are suspicious for acute pancreatitis. There is slightly decreased  attenuation involving the proximal body of the pancreas, which could reflect mild devascularization. No pseudocyst formation is seen at this time. The liver and spleen are unremarkable in appearance. The gallbladder is within normal limits. The pancreas and adrenal glands are unremarkable.  Nonspecific perinephric stranding is noted bilaterally. The kidneys are otherwise unremarkable in appearance. The kidneys are unremarkable in appearance. There is no evidence of hydronephrosis. No renal or ureteral stones are seen.  No free fluid is identified. The small bowel is unremarkable in  appearance. The stomach is within normal limits. No acute vascular abnormalities are seen. Minimal calcification is seen along the abdominal aorta and its branches.  A tiny umbilical hernia is noted, containing only fat.  The appendix is normal in caliber, without evidence for appendicitis. Minimal diverticulosis is noted along the descending and sigmoid colon, without evidence of diverticulitis.  The bladder is mildly distended and grossly unremarkable. The uterus is within normal limits. The ovaries are relatively symmetric. No suspicious adnexal masses are seen. No inguinal lymphadenopathy is seen.  No acute osseous abnormalities are identified.  IMPRESSION: 1. Acute pancreatitis noted, with mild diffuse soft tissue inflammation about the pancreas, extending about the second segment of the duodenum, and trace free fluid tracking inferiorly along the anterior aspect of Gerota's fascia on both sides. Slightly decreased attenuation at the proximal body of the pancreas could reflect mild devascularization. No evidence of pseudocyst formation. 2. Minimal inflammation involves the adjacent gallbladder, though the process appears centered about the pancreas. 5.0 cm gallstone noted. 3. Tiny umbilical hernia, containing only fat. 4. Minimal diverticulosis along the descending and sigmoid colon, without evidence of diverticulitis.    Electronically Signed   By: Roanna Raider M.D.   On: 09/22/2013 22:50   US Abdomen Complete  09/22/2013   CLINICAL DATA:  Nausea, vomiting, and abdominal pain  EXAM: ULTRASOUND ABDOMEN COMPLETE  COMPARISON:  None.  FINDINGS: Gallbladder:  The gallbladder is adequately distended but the lumen is occupied by multiple stones exhibiting a wall echo shadow contour. There is mild gallbladder wall thickening to 4.2 mm. There are stones in the gallbladder neck. There is no positive sonographic Murphy's sign.  Common bile duct:  Diameter:  4.3 mm.  Liver:  The liver demonstrates mildly increased echotexture consistent with fatty infiltration. There is no focal mass or ductal dilation.  IVC:  No abnormality visualized.  Pancreas:  The pancreas was obscured by bowel gas.  Spleen:  Size and appearance within normal limits.  Right Kidney:  Length: 11.4 cm. Echogenicity within normal limits. No mass or hydronephrosis visualized.  Left Kidney:  Length: 12.2 cm. Echogenicity within normal limits. No mass or hydronephrosis visualized.  Abdominal aorta:  Evaluation of the abdominal aorta is limited due to bowel gas and the patient's body habitus.  Other findings:  No ascites is demonstrated.  IMPRESSION: 1. There are multiple gallstones with some impacted in the gallbladder neck. There is mild gallbladder wall thickening but no positive sonographic Murphy's sign. 2. The common bile duct is normal. 3. There are likely fatty infiltrated changes of the liver. 4. The pancreas was obscured by bowel gas.   Electronically Signed   By: David  [place]   On: 09/22/2013 20:03    CBC  Recent Labs Lab 09/22/13 1726 09/23/13 0515 09/24/13 0505 09/25/13 0449 09/26/13 0520  WBC 9.4 11.5* 12.2* 11.3* 8.6  HGB 16.2* 14.8 13.5 13.9 12.1  HCT 46.9* 43.6 41.3 43.5 36.5  PLT 272 255 225 237 220  MCV 86.9 87.2 89.8 90.6 89.2  MCH 30.0 29.6 29.3 29.0 29.6  MCHC 34.5 33.9 32.7 32.0 33.2  RDW 13.1 13.4 13.8 13.6 13.5  LYMPHSABS 0.5*  --    --   --   --   MONOABS 0.5  --   --   --   --   EOSABS 0.0  --   --   --   --   BASOSABS 0.0  --   --   --   --  Chemistries   Recent Labs Lab 09/22/13 1726 09/23/13 0515 09/24/13 0505 09/25/13 0449 09/26/13 0520  NA 140 141 142 136* 137  K 4.5 3.9 3.6* 3.7 4.1  CL 101 106 106 98 101  CO2 26 24 26 25 27   GLUCOSE 146* 110* 108* 108* 110*  BUN 16 14 14 10 9   CREATININE 0.84 0.78 0.81 0.77 0.76  CALCIUM 9.7 8.0* 7.5* 8.2* 8.1*  MG  --   --   --  2.2 2.1  AST 209* 85* 34 22 25  ALT 187* 130* 74* 51* 40*  ALKPHOS 92 82 76 81 72  BILITOT 1.3* 0.6 0.6 0.5 0.5   Lipase     Component Value Date/Time   LIPASE 73* 09/26/2013 0520     ------------------------------------------------------------------------------------------------------------------ estimated creatinine clearance is 92 ml/min (by C-G formula based on Cr of 0.76). ------------------------------------------------------------------------------------------------------------------ No results found for this basename: HGBA1C,  in the last 72 hours ------------------------------------------------------------------------------------------------------------------ No results found for this basename: CHOL, HDL, LDLCALC, TRIG, CHOLHDL, LDLDIRECT,  in the last 72 hours ------------------------------------------------------------------------------------------------------------------ No results found for this basename: TSH, T4TOTAL, FREET3, T3FREE, THYROIDAB,  in the last 72 hours ------------------------------------------------------------------------------------------------------------------ No results found for this basename: VITAMINB12, FOLATE, FERRITIN, TIBC, IRON, RETICCTPCT,  in the last 72 hours  Coagulation profile No results found for this basename: INR, PROTIME,  in the last 168 hours  No results found for this basename: DDIMER,  in the last 72 hours  Cardiac Enzymes No results found for this basename: CK, CKMB,  TROPONINI, MYOGLOBIN,  in the last 168 hours ------------------------------------------------------------------------------------------------------------------ No components found with this basename: POCBNP,      Time Spent in minutes  35   SINGH,[name] on 09/26/2013 at 11:26 AM  Between 7am to 7pm - Pager - [phone]  After 7pm go to [email or web address] - password [email or web address]  And look for the night coverage person covering for me after hours  [facility] Office  [phone]   **Disclaimer: This note may have been dictated with voice recognition software. Similar sounding words can inadvertently be transcribed and this note may contain transcription errors which may not have been corrected upon publication of note.**

## 2013-09-27 LAB — COMPREHENSIVE METABOLIC PANEL
ALK PHOS: 79 U/L (ref 39–117)
ALT: 41 U/L — ABNORMAL HIGH (ref 0–35)
ANION GAP: 9 (ref 5–15)
AST: 26 U/L (ref 0–37)
Albumin: 2.5 g/dL — ABNORMAL LOW (ref 3.5–5.2)
BILIRUBIN TOTAL: 0.4 mg/dL (ref 0.3–1.2)
BUN: 7 mg/dL (ref 6–23)
CO2: 28 mEq/L (ref 19–32)
Calcium: 8.7 mg/dL (ref 8.4–10.5)
Chloride: 103 mEq/L (ref 96–112)
Creatinine, Ser: 0.78 mg/dL (ref 0.50–1.10)
GFR calc non Af Amer: 90 mL/min — ABNORMAL LOW (ref >90)
GLUCOSE: 106 mg/dL — AB (ref 70–99)
POTASSIUM: 4.1 meq/L (ref 3.7–5.3)
Sodium: 140 mEq/L (ref 137–147)
TOTAL PROTEIN: 6 g/dL (ref 6.0–8.3)

## 2013-09-27 LAB — CBC
HCT: 37.6 % (ref 36.0–46.0)
Hemoglobin: 12.4 g/dL (ref 12.0–15.0)
MCH: 29.5 pg (ref 26.0–34.0)
MCHC: 33 g/dL (ref 30.0–36.0)
MCV: 89.3 fL (ref 78.0–100.0)
Platelets: 262 10*3/uL (ref 150–400)
RBC: 4.21 MIL/uL (ref 3.87–5.11)
RDW: 13.5 % (ref 11.5–15.5)
WBC: 7.6 10*3/uL (ref 4.0–10.5)

## 2013-09-27 LAB — MAGNESIUM: Magnesium: 2.1 mg/dL (ref 1.5–2.5)

## 2013-09-27 LAB — GLUCOSE, CAPILLARY: Glucose-Capillary: 96 mg/dL (ref 70–99)

## 2013-09-27 NOTE — Progress Notes (Signed)
Patient Demographics  Tiffany Lane, is a 58 y.o. female, DOB - 03/28/1955, WUJ:[id number]  Admit date - 09/22/2013   Admitting Physician Yevonne PaxSaadat A Khan, MD  Outpatient Primary MD for the patient is [name],[name], MD  LOS - 5   Chief Complaint  Patient presents with  . Abdominal Pain  . Emesis        Subjective:   Tiffany GurneyMary Lane today has, No headache, No chest pain, minimal epigastric abdominal pain  - No Nausea, No new weakness tingling or numbness, No Cough - SOB.     Assessment & Plan    1. Acute gallstone pancreatitis with gallbladder thickening - Gen. surgery following, improved clinically, status post laparoscopic cholecystectomy on 09/25/2013, antibiotics stopped, diet advanced. Surgery monitoring. She is much better pancreatitis has resolved. Clinically stable for discharge but requesting one more day of stay based on her health and personal hygiene needs at home.   2.Allergic rash - has some dermographism , likely from adhesive tape, tape removed, IV Pepcid and Benadryl, DC'd ABX, she refused IV or oral steroids but is agreeable to topical which we will apply it to one area and monitor.     Code Status: Full  Family Communication: Mother bedside  Disposition Plan: Home, patient requests one more day of stay.   Procedures CT scan abdomen pelvis, right upper quadrant ultrasound   Consults [facility]   Medications  Scheduled Meds: . Chlorhexidine Gluconate Cloth  6 each Topical Daily  . clobetasol cream   Topical BID  . diphenhydrAMINE  25 mg Intravenous Once  . docusate sodium  100 mg Oral BID  . famotidine  20 mg Oral Daily  . heparin  5,000 Units Subcutaneous 3 times per day  . mupirocin ointment  1 application Nasal BID   Continuous Infusions: . dextrose 5 % and  0.45% NaCl 1,000 mL with potassium chloride 20 mEq infusion 125 mL/hr at 09/26/13 1553   PRN Meds:.acetaminophen, alum & mag hydroxide-simeth, bisacodyl, diphenhydrAMINE, hydrALAZINE, HYDROcodone-acetaminophen, morphine, morphine injection, ondansetron (ZOFRAN) IV, promethazine, sodium chloride  DVT Prophylaxis  Heparin  Lab Results  Component Value Date   PLT 262 09/27/2013    Antibiotics     Anti-infectives   Start     Dose/Rate Route Frequency Ordered Stop   09/24/13 1700  [MAR Hold]  Ampicillin-Sulbactam (UNASYN) 3 g in sodium chloride 0.9 % 100 mL IVPB  Status:  Discontinued     (On MAR Hold since 09/25/13 1435)   3 g 100 mL/hr over 60 Minutes Intravenous Every 6 hours 09/24/13 1600 09/25/13 1855   09/24/13 1000  cefTRIAXone (ROCEPHIN) 2 g in dextrose 5 % 50 mL IVPB  Status:  Discontinued     2 g 100 mL/hr over 30 Minutes Intravenous Every 24 hours 09/24/13 0750 09/24/13 1555   09/24/13 0800  metroNIDAZOLE (FLAGYL) IVPB 500 mg  Status:  Discontinued     500 mg 100 mL/hr over 60 Minutes Intravenous Every 8 hours 09/24/13 0743 09/24/13 1555   09/24/13 0745  ciprofloxacin (CIPRO) IVPB 400 mg  Status:  Discontinued     400 mg 200 mL/hr over 60 Minutes Intravenous Every 12 hours 09/24/13 0711 09/24/13 0750          Objective:  Filed Vitals:   09/26/13 2151 09/27/13 0147 09/27/13 0616 09/27/13 1030  BP: 143/74 144/62 127/69 157/73  Pulse: 86 84 76 78  Temp: 98.5 F (36.9 C) 99.4 F (37.4 C) 98.2 F (36.8 C) 98.7 F (37.1 C)  TempSrc: Oral Oral Oral Oral  Resp: 18 18 17 18   Height:      Weight:      SpO2: 90% 95% 91% 92%    Wt Readings from Last 3 Encounters:  09/23/13 105.235 kg (232 lb)  09/23/13 105.235 kg (232 lb)  08/22/07 101.152 kg (223 lb)     Intake/Output Summary (Last 24 hours) at 09/27/13 1100 Last data filed at 09/27/13 0900  Gross per 24 hour  Intake   3340 ml  Output   5275 ml  Net  -1935 ml     Physical Exam  Awake Alert, Oriented X 3,  No new F.N deficits, Normal affect [place].AT,PERRAL Supple Neck,No JVD, No cervical lymphadenopathy appriciated.  Symmetrical Chest wall movement, Good air movement bilaterally, CTAB RRR,No Gallops,Rubs or new Murmurs, No Parasternal Heave +ve B.Sounds, Abd Soft, positive epigastric tenderness, No organomegaly appriciated, No rebound - guarding or rigidity. No Cyanosis, Clubbing or edema, +ve macular rash inner thighs and trunk area some dermographism from tape site     Data Review   Micro Results Recent Results (from the past 240 hour(s))  SURGICAL PCR SCREEN     Status: Abnormal   Collection Time    09/25/13  1:16 PM      Result Value Ref Range Status   MRSA, PCR NEGATIVE  NEGATIVE Final   Staphylococcus aureus POSITIVE (*) NEGATIVE Final   Comment:            The Xpert SA Assay (FDA     approved for NASAL specimens     in patients over 9 years of age),     is one component of     a comprehensive surveillance     program.  Test performance has     been validated by [employer] for patients greater     than or equal to 43 year old.     It is not intended     to diagnose infection nor to     guide or monitor treatment.    Radiology Reports Ct Abdomen Pelvis Wo Contrast  09/22/2013   CLINICAL DATA:  Mid abdominal pain, nausea and vomiting. Elevated LFTs.  EXAM: CT ABDOMEN AND PELVIS WITHOUT CONTRAST  TECHNIQUE: Multidetector CT imaging of the abdomen and pelvis was performed following the standard protocol without IV contrast.  COMPARISON:  Abdominal ultrasound performed earlier today at 6:27 p.m.  FINDINGS: The visualized lung bases are clear.  There is mild diffuse soft tissue inflammation about the pancreas, extending about the second segment of the duodenum, with trace free fluid tracking inferiorly along Gerota's fascia bilaterally. Minimal inflammation involves the adjacent gallbladder, though the process appears centered about the pancreas. A 5.0 cm stone is noted within  the gallbladder, and there appears to be a somewhat high density Phrygian cap.  Findings are suspicious for acute pancreatitis. There is slightly decreased attenuation involving the proximal body of the pancreas, which could reflect mild devascularization. No pseudocyst formation is seen at this time. The liver and spleen are unremarkable in appearance. The gallbladder is within normal limits. The pancreas and adrenal glands are unremarkable.  Nonspecific perinephric stranding is noted bilaterally. The kidneys are otherwise unremarkable in appearance. The kidneys  are unremarkable in appearance. There is no evidence of hydronephrosis. No renal or ureteral stones are seen.  No free fluid is identified. The small bowel is unremarkable in appearance. The stomach is within normal limits. No acute vascular abnormalities are seen. Minimal calcification is seen along the abdominal aorta and its branches.  A tiny umbilical hernia is noted, containing only fat.  The appendix is normal in caliber, without evidence for appendicitis. Minimal diverticulosis is noted along the descending and sigmoid colon, without evidence of diverticulitis.  The bladder is mildly distended and grossly unremarkable. The uterus is within normal limits. The ovaries are relatively symmetric. No suspicious adnexal masses are seen. No inguinal lymphadenopathy is seen.  No acute osseous abnormalities are identified.  IMPRESSION: 1. Acute pancreatitis noted, with mild diffuse soft tissue inflammation about the pancreas, extending about the second segment of the duodenum, and trace free fluid tracking inferiorly along the anterior aspect of Gerota's fascia on both sides. Slightly decreased attenuation at the proximal body of the pancreas could reflect mild devascularization. No evidence of pseudocyst formation. 2. Minimal inflammation involves the adjacent gallbladder, though the process appears centered about the pancreas. 5.0 cm gallstone noted. 3. Tiny  umbilical hernia, containing only fat. 4. Minimal diverticulosis along the descending and sigmoid colon, without evidence of diverticulitis.   Electronically Signed   By: Roanna Raider M.D.   On: 09/22/2013 22:50   US Abdomen Complete  09/22/2013   CLINICAL DATA:  Nausea, vomiting, and abdominal pain  EXAM: ULTRASOUND ABDOMEN COMPLETE  COMPARISON:  None.  FINDINGS: Gallbladder:  The gallbladder is adequately distended but the lumen is occupied by multiple stones exhibiting a wall echo shadow contour. There is mild gallbladder wall thickening to 4.2 mm. There are stones in the gallbladder neck. There is no positive sonographic Murphy's sign.  Common bile duct:  Diameter:  4.3 mm.  Liver:  The liver demonstrates mildly increased echotexture consistent with fatty infiltration. There is no focal mass or ductal dilation.  IVC:  No abnormality visualized.  Pancreas:  The pancreas was obscured by bowel gas.  Spleen:  Size and appearance within normal limits.  Right Kidney:  Length: 11.4 cm. Echogenicity within normal limits. No mass or hydronephrosis visualized.  Left Kidney:  Length: 12.2 cm. Echogenicity within normal limits. No mass or hydronephrosis visualized.  Abdominal aorta:  Evaluation of the abdominal aorta is limited due to bowel gas and the patient's body habitus.  Other findings:  No ascites is demonstrated.  IMPRESSION: 1. There are multiple gallstones with some impacted in the gallbladder neck. There is mild gallbladder wall thickening but no positive sonographic Murphy's sign. 2. The common bile duct is normal. 3. There are likely fatty infiltrated changes of the liver. 4. The pancreas was obscured by bowel gas.   Electronically Signed   By: David  [place]   On: 09/22/2013 20:03    CBC  Recent Labs Lab 09/22/13 1726 09/23/13 0515 09/24/13 0505 09/25/13 0449 09/26/13 0520 09/27/13 0540  WBC 9.4 11.5* 12.2* 11.3* 8.6 7.6  HGB 16.2* 14.8 13.5 13.9 12.1 12.4  HCT 46.9* 43.6 41.3 43.5 36.5 37.6   PLT 272 255 225 237 220 262  MCV 86.9 87.2 89.8 90.6 89.2 89.3  MCH 30.0 29.6 29.3 29.0 29.6 29.5  MCHC 34.5 33.9 32.7 32.0 33.2 33.0  RDW 13.1 13.4 13.8 13.6 13.5 13.5  LYMPHSABS 0.5*  --   --   --   --   --   MONOABS 0.5  --   --   --   --   --  EOSABS 0.0  --   --   --   --   --   BASOSABS 0.0  --   --   --   --   --     Chemistries   Recent Labs Lab 09/23/13 0515 09/24/13 0505 09/25/13 0449 09/26/13 0520 09/27/13 0540  NA 141 142 136* 137 140  K 3.9 3.6* 3.7 4.1 4.1  CL 106 106 98 101 103  CO2 24 26 25 27 28   GLUCOSE 110* 108* 108* 110* 106*  BUN 14 14 10 9 7   CREATININE 0.78 0.81 0.77 0.76 0.78  CALCIUM 8.0* 7.5* 8.2* 8.1* 8.7  MG  --   --  2.2 2.1 2.1  AST 85* 34 22 25 26   ALT 130* 74* 51* 40* 41*  ALKPHOS 82 76 81 72 79  BILITOT 0.6 0.6 0.5 0.5 0.4   Lipase     Component Value Date/Time   LIPASE 73* 09/26/2013 0520     ------------------------------------------------------------------------------------------------------------------ estimated creatinine clearance is 92 ml/min (by C-G formula based on Cr of 0.78). ------------------------------------------------------------------------------------------------------------------ No results found for this basename: HGBA1C,  in the last 72 hours ------------------------------------------------------------------------------------------------------------------ No results found for this basename: CHOL, HDL, LDLCALC, TRIG, CHOLHDL, LDLDIRECT,  in the last 72 hours ------------------------------------------------------------------------------------------------------------------ No results found for this basename: TSH, T4TOTAL, FREET3, T3FREE, THYROIDAB,  in the last 72 hours ------------------------------------------------------------------------------------------------------------------ No results found for this basename: VITAMINB12, FOLATE, FERRITIN, TIBC, IRON, RETICCTPCT,  in the last 72 hours  Coagulation  profile No results found for this basename: INR, PROTIME,  in the last 168 hours  No results found for this basename: DDIMER,  in the last 72 hours  Cardiac Enzymes No results found for this basename: CK, CKMB, TROPONINI, MYOGLOBIN,  in the last 168 hours ------------------------------------------------------------------------------------------------------------------ No components found with this basename: POCBNP,      Time Spent in minutes  35   Kassiah Mccrory K M.D on 09/27/2013 at 11:00 AM  Between 7am to 7pm - Pager - [phone]  After 7pm go to [email or web address] - password [email or web address]  And look for the night coverage person covering for me after hours  [facility] Office  [phone]   **Disclaimer: This note may have been dictated with voice recognition software. Similar sounding words can inadvertently be transcribed and this note may contain transcription errors which may not have been corrected upon publication of note.**

## 2013-09-27 NOTE — Progress Notes (Signed)
Patient ID: Tiffany Lane, female   DOB: [birth year], 58 y.o.   MRN: [id number] 2 Days Post-Op  Subjective: Main concern is incisional pain around her umbilical incision which makes it difficult for her to get in and out of bed and take care of herself. She has no help at home. Also continues to have a significant rash with itching. No generalized abdominal pain. Tolerating diet without nausea.  Objective: Vital signs in last 24 hours: Temp:  [98.2 F (36.8 C)-99.4 F (37.4 C)] 98.2 F (36.8 C) (08/16 0616) Pulse Rate:  [67-86] 76 (08/16 0616) Resp:  [16-20] 17 (08/16 0616) BP: (127-147)/(62-91) 127/69 mmHg (08/16 0616) SpO2:  [90 %-95 %] 91 % (08/16 0616) Last BM Date: 09/22/13  Intake/Output from previous day: 08/15 0701 - 08/16 0700 In: 3720 [P.O.:720; I.V.:3000] Out: 5025 [Urine:5025] Intake/Output this shift:    General appearance: alert, cooperative and no distress GI: generally soft and nontender. Some appropriate tenderness around the umbilical incision Skin: Erythematous macular rash over her trunk and thighs Incision/Wound: cclean and dry without evidence of infection  Lab Results:   Recent Labs  09/26/13 0520 09/27/13 0540  WBC 8.6 7.6  HGB 12.1 12.4  HCT 36.5 37.6  PLT 220 262   BMET  Recent Labs  09/26/13 0520 09/27/13 0540  NA 137 140  K 4.1 4.1  CL 101 103  CO2 27 28  GLUCOSE 110* 106*  BUN 9 7  CREATININE 0.76 0.78  CALCIUM 8.1* 8.7   Lab Results  Component Value Date   ALT 41* 09/27/2013   AST 26 09/27/2013   ALKPHOS 79 09/27/2013   BILITOT 0.4 09/27/2013   Lipase     Component Value Date/Time   LIPASE 73* 09/26/2013 0520      Studies/Results: Dg Cholangiogram Operative  09/25/2013   CLINICAL DATA:  Cholelithiasis.  EXAM: INTRAOPERATIVE CHOLANGIOGRAM  TECHNIQUE: Cholangiographic images from the C-arm fluoroscopic device were submitted for interpretation post-operatively. Please see the procedural report for the amount of contrast and  the fluoroscopy time utilized.  COMPARISON:  Abdominal CT 09/22/2013  FINDINGS: Contrast injection demonstrates opacification of the cystic duct, common bile duct and duodenum. No large filling defects or stones. There is minor filling of the intrahepatic biliary system.  IMPRESSION: Patent biliary system without obstructing stones or lesions.   Electronically Signed   By: Richarda Overlie M.D.   On: 09/25/2013 16:20    Anti-infectives: Anti-infectives   Start     Dose/Rate Route Frequency Ordered Stop   09/24/13 1700  [MAR Hold]  Ampicillin-Sulbactam (UNASYN) 3 g in sodium chloride 0.9 % 100 mL IVPB  Status:  Discontinued     (On MAR Hold since 09/25/13 1435)   3 g 100 mL/hr over 60 Minutes Intravenous Every 6 hours 09/24/13 1600 09/25/13 1855   09/24/13 1000  cefTRIAXone (ROCEPHIN) 2 g in dextrose 5 % 50 mL IVPB  Status:  Discontinued     2 g 100 mL/hr over 30 Minutes Intravenous Every 24 hours 09/24/13 0750 09/24/13 1555   09/24/13 0800  metroNIDAZOLE (FLAGYL) IVPB 500 mg  Status:  Discontinued     500 mg 100 mL/hr over 60 Minutes Intravenous Every 8 hours 09/24/13 0743 09/24/13 1555   09/24/13 0745  ciprofloxacin (CIPRO) IVPB 400 mg  Status:  Discontinued     400 mg 200 mL/hr over 60 Minutes Intravenous Every 12 hours 09/24/13 0711 09/24/13 0750      Assessment/Plan: s/p Procedure(s): LAPAROSCOPIC CHOLECYSTECTOMY WITH INTRAOPERATIVE CHOLANGIOGRAM, REPAIR  OF UMBILICAL HERNIA Generally doing well without apparent complication. Incisional discomfort limiting mobility. Atopic dermatitis which started prior to surgery apparently from EKG leads I think she likely would benefit from another day in the hospital so that she would be safe taking care of herself at home.   LOS: 5 days    Talin Feister T 09/27/2013

## 2013-09-27 NOTE — Anesthesia Postprocedure Evaluation (Signed)
  Anesthesia Post-op Note  Patient: Tiffany Lane  Procedure(s) Performed: Procedure(s) (LRB): LAPAROSCOPIC CHOLECYSTECTOMY WITH INTRAOPERATIVE CHOLANGIOGRAM, REPAIR OF UMBILICAL HERNIA (N/A)  Patient Location: PACU  Anesthesia Type: General  Level of Consciousness: awake and alert   Airway and Oxygen Therapy: Patient Spontanous Breathing  Post-op Pain: mild  Post-op Assessment: Post-op Vital signs reviewed, Patient's Cardiovascular Status Stable, Respiratory Function Stable, Patent Airway and No signs of Nausea or vomiting  Last Vitals:  Filed Vitals:   09/27/13 0616  BP: 127/69  Pulse: 76  Temp: 36.8 C  Resp: 17    Post-op Vital Signs: stable   Complications: No apparent anesthesia complications

## 2013-09-28 ENCOUNTER — Encounter (HOSPITAL_COMMUNITY): Payer: Self-pay | Admitting: Surgery

## 2013-09-28 LAB — GLUCOSE, CAPILLARY: Glucose-Capillary: 109 mg/dL — ABNORMAL HIGH (ref 70–99)

## 2013-09-28 MED ORDER — POLYETHYLENE GLYCOL 3350 17 G PO PACK: 17.0000 g | PACK | Freq: Every day | ORAL | Status: DC

## 2013-09-28 MED ORDER — DSS 100 MG PO CAPS: 100.0000 mg | ORAL_CAPSULE | Freq: Two times a day (BID) | ORAL | Status: DC

## 2013-09-28 MED ORDER — DIPHENHYDRAMINE HCL 25 MG PO CAPS: 25.0000 mg | ORAL_CAPSULE | Freq: Four times a day (QID) | ORAL | Status: DC | PRN

## 2013-09-28 MED ORDER — IBUPROFEN 200 MG PO TABS: 600.0000 mg | ORAL_TABLET | Freq: Three times a day (TID) | ORAL | Status: DC | PRN

## 2013-09-28 MED ORDER — CLOBETASOL PROPIONATE 0.05 % EX CREA: TOPICAL_CREAM | Freq: Two times a day (BID) | CUTANEOUS | Status: DC

## 2013-09-28 MED ORDER — HYDROCODONE-ACETAMINOPHEN 5-325 MG PO TABS: 1.0000 | ORAL_TABLET | ORAL | Status: DC | PRN

## 2013-09-28 NOTE — Discharge Summary (Signed)
Tiffany Lane, is a 58 y.o. female  DOB 1955-11-05  MRN [id number].  Admission date:  09/22/2013  Admitting Physician  Yevonne Pax, MD  Discharge Date:  09/28/2013   Primary MD  Lorretta Harp, MD  Recommendations for primary care physician for things to follow:   Monitor skin rash, check CBC CMP and lipase in 1 week.   Admission Diagnosis  Acute gallstone pancreatitis [577.0, 574.20]   Discharge Diagnosis  Acute gallstone pancreatitis [577.0, 574.20]     Active Problems:   Acute gallstone pancreatitis   Gallstones      Past Medical History  Diagnosis Date  . Migraine     Past Surgical History  Procedure Laterality Date  . Myringotomy with tube placement         History of present illness and  Hospital Course:     Kindly see H&P for history of present illness and admission details, please review complete Labs, Consult reports and Test reports for all details in brief  HPI  from the history and physical done on the day of admission   Tiffany Lane is a 58 y.o. female presents with abdominal pain. Patient states tha she has noted the pain for about 3 days now. She has had indigestion and epigastric pain. Patient states that she had no fevers noted. She states pain does not appear to radiate anywhere else. She initially tried to take some of her famotidine but this did not seem to help. She states that she has never had any pancreatic issues in the past. Patient states that she does not drink and does not smoke. She did have diarrhea but no blood in her stools.    Hospital Course    1. Acute gallstone pancreatitis with gallbladder thickening - Gen. surgery following, improved clinically, status post laparoscopic cholecystectomy on 09/25/2013, antibiotics stopped, diet advanced. She is much better with  pancreatitis resolved, tolerating diet and eager to go home cleared by surgery for discharge as well.    2.Allergic rash - has some dermographism , likely from adhesive tape, tape removed, rash improving with Benadryl and topical steroid cream.   3. Mild hypertension. Patient counseled on weight reduction, we'll request PCP to monitor medication if required.     Discharge Condition: stable   Follow UP  Follow-up Information   Follow up with [facility] On 10/20/2013. (For post-operation check. Your appointment is at 2:30pm, please arrive at least 30 min before your appointment to complete your check in paperwork.  If you are unable to arrive 30 min prior to your appointment time we may have to cancel or reschedule you)    Contact information:   [address]   [place] [address] [phone]       Follow up with Lorretta Harp, MD In 3 days.   Specialties:  Internal Medicine, Pediatrics   Contact information:   [address] [place] [address] [phone]  Discharge Instructions  and  Discharge Medications          Discharge Instructions   Discharge instructions    Complete by:  As directed   Follow with Primary MD Lorretta Harp, MD in 3 days   Get CBC, CMP, 2 view Chest X ray checked  by Primary MD next visit.    Activity: As tolerated with Full fall precautions use walker/cane & assistance as needed   Disposition Home    Diet: Heart Healthy    For Heart failure patients - Check your Weight same time everyday, if you gain over 2 pounds, or you develop in leg swelling, experience more shortness of breath or chest pain, call your Primary MD immediately. Follow Cardiac Low Salt Diet and 1.8 lit/day fluid restriction.   On your next visit with her primary care physician please Get Medicines reviewed and adjusted.  Please request your Prim.MD to go over all Hospital Tests and Procedure/Radiological  results at the follow up, please get all Hospital records sent to your Prim MD by signing hospital release before you go home.   If you experience worsening of your admission symptoms, develop shortness of breath, life threatening emergency, suicidal or homicidal thoughts you must seek medical attention immediately by calling 911 or calling your MD immediately  if symptoms less severe.  You Must read complete instructions/literature along with all the possible adverse reactions/side effects for all the Medicines you take and that have been prescribed to you. Take any new Medicines after you have completely understood and accpet all the possible adverse reactions/side effects.   Do not drive, operating heavy machinery, perform activities at heights, swimming or participation in water activities or provide baby sitting services if your were admitted for syncope or siezures until you have seen by Primary MD or a Neurologist and advised to do so again.  Do not drive when taking Pain medications.    Do not take more than prescribed Pain, Sleep and Anxiety Medications  Special Instructions: If you have smoked or chewed Tobacco  in the last 2 yrs please stop smoking, stop any regular Alcohol  and or any Recreational drug use.  Wear Seat belts while driving.   Please note  You were cared for by a hospitalist during your hospital stay. If you have any questions about your discharge medications or the care you received while you were in the hospital after you are discharged, you can call the unit and asked to speak with the hospitalist on call if the hospitalist that took care of you is not available. Once you are discharged, your primary care physician will handle any further medical issues. Please note that NO REFILLS for any discharge medications will be authorized once you are discharged, as it is imperative that you return to your primary care physician (or establish a relationship with a primary  care physician if you do not have one) for your aftercare needs so that they can reassess your need for medications and monitor your lab values.                                                       Tiffany Lane was admitted to the Hospital on 09/22/2013 and Discharged  09/28/2013 and should be excused from work/school   for 14-21 days starting 09/22/2013 , may  return to work/school without any restrictions.  Call Susa Raring MD, [facility] [phone] with questions.  Leroy Sea M.D on 09/28/2013,at 8:48 AM  [facility]   Office  [phone]     Increase activity slowly    Complete by:  As directed             Medication List         clobetasol cream 0.05 %  Commonly known as:  TEMOVATE  Apply topically 2 (two) times daily.     diphenhydrAMINE 25 mg capsule  Commonly known as:  BENADRYL  Take 1 capsule (25 mg total) by mouth every 6 (six) hours as needed for itching or allergies.     DSS 100 MG Caps  Take 100 mg by mouth 2 (two) times daily.     FAMOTIDINE PO  Take 1 tablet by mouth daily as needed (reflux.).     HYDROcodone-acetaminophen 5-325 MG per tablet  Commonly known as:  NORCO/VICODIN  Take 1 tablet by mouth every 4 (four) hours as needed for moderate pain.     ibuprofen 200 MG tablet  Commonly known as:  ADVIL,MOTRIN  Take 3 tablets (600 mg total) by mouth every 8 (eight) hours as needed (pain.).     polyethylene glycol packet  Commonly known as:  MIRALAX  Take 17 g by mouth daily.          Diet and Activity recommendation: See Discharge Instructions above   Consults obtained - [facility]   Major procedures and Radiology Reports - PLEASE review detailed and final reports for all details, in brief -   Lap Choly   Ct Abdomen Pelvis Wo Contrast  09/22/2013   CLINICAL DATA:  Mid abdominal pain, nausea and vomiting. Elevated LFTs.  EXAM: CT ABDOMEN AND PELVIS WITHOUT CONTRAST  TECHNIQUE: Multidetector CT imaging of the abdomen  and pelvis was performed following the standard protocol without IV contrast.  COMPARISON:  Abdominal ultrasound performed earlier today at 6:27 p.m.  FINDINGS: The visualized lung bases are clear.  There is mild diffuse soft tissue inflammation about the pancreas, extending about the second segment of the duodenum, with trace free fluid tracking inferiorly along Gerota's fascia bilaterally. Minimal inflammation involves the adjacent gallbladder, though the process appears centered about the pancreas. A 5.0 cm stone is noted within the gallbladder, and there appears to be a somewhat high density Phrygian cap.  Findings are suspicious for acute pancreatitis. There is slightly decreased attenuation involving the proximal body of the pancreas, which could reflect mild devascularization. No pseudocyst formation is seen at this time. The liver and spleen are unremarkable in appearance. The gallbladder is within normal limits. The pancreas and adrenal glands are unremarkable.  Nonspecific perinephric stranding is noted bilaterally. The kidneys are otherwise unremarkable in appearance. The kidneys are unremarkable in appearance. There is no evidence of hydronephrosis. No renal or ureteral stones are seen.  No free fluid is identified. The small bowel is unremarkable in appearance. The stomach is within normal limits. No acute vascular abnormalities are seen. Minimal calcification is seen along the abdominal aorta and its branches.  A tiny umbilical hernia is noted, containing only fat.  The appendix is normal in caliber, without evidence for appendicitis. Minimal diverticulosis is noted along the descending and sigmoid colon, without evidence of diverticulitis.  The bladder is mildly distended and grossly unremarkable. The uterus is within normal limits. The ovaries are relatively symmetric. No suspicious adnexal masses are seen. No inguinal lymphadenopathy is seen.  No acute osseous abnormalities are identified.   IMPRESSION: 1. Acute pancreatitis noted, with mild diffuse soft tissue inflammation about the pancreas, extending about the second segment of the duodenum, and trace free fluid tracking inferiorly along the anterior aspect of Gerota's fascia on both sides. Slightly decreased attenuation at the proximal body of the pancreas could reflect mild devascularization. No evidence of pseudocyst formation. 2. Minimal inflammation involves the adjacent gallbladder, though the process appears centered about the pancreas. 5.0 cm gallstone noted. 3. Tiny umbilical hernia, containing only fat. 4. Minimal diverticulosis along the descending and sigmoid colon, without evidence of diverticulitis.   Electronically Signed   By: Roanna RaiderJeffery  Chang M.D.   On: 09/22/2013 22:50   Dg Cholangiogram Operative  09/25/2013   CLINICAL DATA:  Cholelithiasis.  EXAM: INTRAOPERATIVE CHOLANGIOGRAM  TECHNIQUE: Cholangiographic images from the C-arm fluoroscopic device were submitted for interpretation post-operatively. Please see the procedural report for the amount of contrast and the fluoroscopy time utilized.  COMPARISON:  Abdominal CT 09/22/2013  FINDINGS: Contrast injection demonstrates opacification of the cystic duct, common bile duct and duodenum. No large filling defects or stones. There is minor filling of the intrahepatic biliary system.  IMPRESSION: Patent biliary system without obstructing stones or lesions.   Electronically Signed   By: Richarda OverlieAdam  Henn M.D.   On: 09/25/2013 16:20   [place] Abdomen Complete  09/22/2013   CLINICAL DATA:  Nausea, vomiting, and abdominal pain  EXAM: ULTRASOUND ABDOMEN COMPLETE  COMPARISON:  None.  FINDINGS: Gallbladder:  The gallbladder is adequately distended but the lumen is occupied by multiple stones exhibiting a wall echo shadow contour. There is mild gallbladder wall thickening to 4.2 mm. There are stones in the gallbladder neck. There is no positive sonographic Murphy's sign.  Common bile duct:  Diameter:   4.3 mm.  Liver:  The liver demonstrates mildly increased echotexture consistent with fatty infiltration. There is no focal mass or ductal dilation.  IVC:  No abnormality visualized.  Pancreas:  The pancreas was obscured by bowel gas.  Spleen:  Size and appearance within normal limits.  Right Kidney:  Length: 11.4 cm. Echogenicity within normal limits. No mass or hydronephrosis visualized.  Left Kidney:  Length: 12.2 cm. Echogenicity within normal limits. No mass or hydronephrosis visualized.  Abdominal aorta:  Evaluation of the abdominal aorta is limited due to bowel gas and the patient's body habitus.  Other findings:  No ascites is demonstrated.  IMPRESSION: 1. There are multiple gallstones with some impacted in the gallbladder neck. There is mild gallbladder wall thickening but no positive sonographic Murphy's sign. 2. The common bile duct is normal. 3. There are likely fatty infiltrated changes of the liver. 4. The pancreas was obscured by bowel gas.   Electronically Signed   By: David  SwazilandJordan   On: 09/22/2013 20:03    Micro Results      Recent Results (from the past 240 hour(s))  SURGICAL PCR SCREEN     Status: Abnormal   Collection Time    09/25/13  1:16 PM      Result Value Ref Range Status   MRSA, PCR NEGATIVE  NEGATIVE Final   Staphylococcus aureus POSITIVE (*) NEGATIVE Final   Comment:            The Xpert SA Assay (FDA     approved for NASAL specimens     in patients over 60 years of age),     is one component of     a comprehensive surveillance  program.  Test performance has     been validated by [facility] for patients greater     than or equal to 40 year old.     It is not intended     to diagnose infection nor to     guide or monitor treatment.       Today   Subjective:   Tiffany Lane today has no headache,no chest abdominal pain,no new weakness tingling or numbness, feels much better wants to go home today.    Objective:   Blood pressure 155/86, pulse  88, temperature 98.4 F (36.9 C), temperature source Oral, resp. rate 18, height 5' 5.5" (1.664 m), weight 105.235 kg (232 lb), SpO2 92.00%.   Intake/Output Summary (Last 24 hours) at 09/28/13 1045 Last data filed at 09/28/13 0135  Gross per 24 hour  Intake    120 ml  Output   2650 ml  Net  -2530 ml    Exam Awake Alert, Oriented x 3, No new F.N deficits, Normal affect [place].AT,PERRAL Supple Neck,No JVD, No cervical lymphadenopathy appriciated.  Symmetrical Chest wall movement, Good air movement bilaterally, CTAB RRR,No Gallops,Rubs or new Murmurs, No Parasternal Heave +ve B.Sounds, Abd Soft, Non tender, No organomegaly appriciated, No rebound -guarding or rigidity. No Cyanosis, Clubbing or edema, macular rash much improved at the site of steroid application, laparoscopic incision sites stable.  Data Review   CBC w Diff: Lab Results  Component Value Date   WBC 7.6 09/27/2013   HGB 12.4 09/27/2013   HCT 37.6 09/27/2013   PLT 262 09/27/2013   LYMPHOPCT 5* 09/22/2013   MONOPCT 5 09/22/2013   EOSPCT 0 09/22/2013   BASOPCT 0 09/22/2013    CMP: Lab Results  Component Value Date   NA 140 09/27/2013   K 4.1 09/27/2013   CL 103 09/27/2013   CO2 28 09/27/2013   BUN 7 09/27/2013   CREATININE 0.78 09/27/2013   PROT 6.0 09/27/2013   ALBUMIN 2.5* 09/27/2013   BILITOT 0.4 09/27/2013   ALKPHOS 79 09/27/2013   AST 26 09/27/2013   ALT 41* 09/27/2013  .   Total Time in preparing paper work, data evaluation and todays exam - 35 minutes  Leroy Sea M.D on 09/28/2013 at 10:45 AM  [facility] Office  [phone]   **Disclaimer: This note may have been dictated with voice recognition software. Similar sounding words can inadvertently be transcribed and this note may contain transcription errors which may not have been corrected upon publication of note.**

## 2013-09-28 NOTE — Progress Notes (Signed)
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

## 2013-09-28 NOTE — Progress Notes (Signed)
Patient seen and examined.  Agree with PA's note.  

## 2013-09-28 NOTE — Care Management Note (Addendum)
    Page 1 of 1   09/28/2013     3:28:53 PM CARE MANAGEMENT NOTE 09/28/2013  Patient:  Champ MungoOSBORNE,Beretta K   Account Number:  [id number]  Date Initiated:  09/28/2013  Documentation initiated by:  Berenda MoraleHEFNER,NORA  Subjective/Objective Assessment:   Patient is a 58 y.o., female admitted with Cholelithiasis, umbilical hernia, abdominal/back/leg rash.  Patient underwent LAPAROSCOPIC CHOLECYSTECTOMY WITH INTRAOPERATIVE CHOLANGIOGRAM, REPAIR OF UMBILICAL HERNIA.     Action/Plan:   Assess patient for medication and financial needs.   Anticipated DC Date:  09/28/2013   Anticipated DC Plan:  HOME/SELF CARE      DC Planning Services  CM consult      Choice offered to / List presented to:             Status of service:  Completed, signed off Medicare Important Message given?   (If response is "NO", the following Medicare IM given date fields will be blank) Date Medicare IM given:   Medicare IM given by:   Date Additional Medicare IM given:   Additional Medicare IM given by:    Discharge Disposition:  HOME/SELF CARE  Per UR Regulation:  Reviewed for med. necessity/level of care/duration of stay  If discussed at Long Length of Stay Meetings, dates discussed:    Comments:  09/28/13 Berenda MoraleNora Hefner RN Case Manager 1030am  Patient had questions about the extent of her bill and if there would be individual charges vs one bill.  Patient expressed need to set up a payment plan with the hospital. Explained to patient that a total amount of bill could not be determind at this time and she would be able to set up a payment plan by calling the number on her bill when she receives it.  Patient also had questions about medication assistance.  Her discharge meds were reviewed and due to her having insurance it was explained there was little additional help available for medications.  She was encouraged to ask her MD for generic medications.  Patient appreciative of my assistance.

## 2013-09-28 NOTE — Progress Notes (Signed)
[facility] Progress Note  3 Days Post-Op  Subjective: Pain well controlled.  No NV.  Ambulating well.  Rash on body improved where they used steroid cream.  Very itchy.  Ready to go home.  Questions,about cost of pain meds.    Objective: Vital signs in last 24 hours: Temp:  [98.1 F (36.7 C)-98.9 F (37.2 C)] 98.4 F (36.9 C) (08/17 0528) Pulse Rate:  [78-89] 88 (08/17 0528) Resp:  [18] 18 (08/17 0528) BP: (153-160)/(70-86) 155/86 mmHg (08/17 0528) SpO2:  [92 %-95 %] 92 % (08/17 0528) Last BM Date: 09/22/13  Intake/Output from previous day: 08/16 0701 - 08/17 0700 In: 360 [P.O.:360] Out: 3400 [Urine:3400] Intake/Output this shift:    PE: Gen:  Alert, NAD, pleasant Abd: Soft,appropriately tender, ND, +BS, no HSM, incisions C/D/I   Lab Results:   Recent Labs  09/26/13 0520 09/27/13 0540  WBC 8.6 7.6  HGB 12.1 12.4  HCT 36.5 37.6  PLT 220 262   BMET  Recent Labs  09/26/13 0520 09/27/13 0540  NA 137 140  K 4.1 4.1  CL 101 103  CO2 27 28  GLUCOSE 110* 106*  BUN 9 7  CREATININE 0.76 0.78  CALCIUM 8.1* 8.7   PT/INR No results found for this basename: LABPROT, INR,  in the last 72 hours CMP     Component Value Date/Time   NA 140 09/27/2013 0540   K 4.1 09/27/2013 0540   CL 103 09/27/2013 0540   CO2 28 09/27/2013 0540   GLUCOSE 106* 09/27/2013 0540   BUN 7 09/27/2013 0540   CREATININE 0.78 09/27/2013 0540   CALCIUM 8.7 09/27/2013 0540   PROT 6.0 09/27/2013 0540   ALBUMIN 2.5* 09/27/2013 0540   AST 26 09/27/2013 0540   ALT 41* 09/27/2013 0540   ALKPHOS 79 09/27/2013 0540   BILITOT 0.4 09/27/2013 0540   GFRNONAA 90* 09/27/2013 0540   GFRAA >90 09/27/2013 0540   Lipase     Component Value Date/Time   LIPASE 73* 09/26/2013 0520       Studies/Results: No results found.  Anti-infectives: Anti-infectives   Start     Dose/Rate Route Frequency Ordered Stop   09/24/13 1700  [MAR Hold]  Ampicillin-Sulbactam (UNASYN) 3 g in sodium chloride 0.9 %  100 mL IVPB  Status:  Discontinued     (On MAR Hold since 09/25/13 1435)   3 g 100 mL/hr over 60 Minutes Intravenous Every 6 hours 09/24/13 1600 09/25/13 1855   09/24/13 1000  cefTRIAXone (ROCEPHIN) 2 g in dextrose 5 % 50 mL IVPB  Status:  Discontinued     2 g 100 mL/hr over 30 Minutes Intravenous Every 24 hours 09/24/13 0750 09/24/13 1555   09/24/13 0800  metroNIDAZOLE (FLAGYL) IVPB 500 mg  Status:  Discontinued     500 mg 100 mL/hr over 60 Minutes Intravenous Every 8 hours 09/24/13 0743 09/24/13 1555   09/24/13 0745  ciprofloxacin (CIPRO) IVPB 400 mg  Status:  Discontinued     400 mg 200 mL/hr over 60 Minutes Intravenous Every 12 hours 09/24/13 0711 09/24/13 0750       Assessment/Plan Chronic cholecystitis, cholelithiasis (5 cm gall stone), umbilical hernia POD #3 s/p lap chole with neg IOC and umbilical hernia repair Atopic Dermatitis  Plan: 1.  D/c home per medicine team with pain meds 2.  May need steroid cream for dermatitis 3.  FU appt with [facility] 10/20/13 at 2:30pm 4.  Encouraged ambulation, IS 5.  Discussed post-op instructions  LOS: 6 days    Aris Georgia 09/28/2013, 8:40 AM Pager: [phone]

## 2013-09-28 NOTE — Progress Notes (Signed)
Nursing Discharge Summary  Patient ID: Tiffany Lane MRN: [id number] DOB/AGE: 60/07/1955 60 y.o.  Admit date: 09/22/2013 Discharge date: 09/28/2013  Discharged Condition: good  Disposition: Final discharge disposition not confirmed  Follow-up Information   Follow up with [facility] On 10/20/2013. (For post-operation check. Your appointment is at 2:30pm, please arrive at least 30 min before your appointment to complete your check in paperwork.  If you are unable to arrive 30 min prior to your appointment time we may have to cancel or reschedule you)    Contact information:   [address]   [place] [address] [phone]       Follow up with [name],[name], MD In 3 days.   Specialties:  Internal Medicine, Pediatrics   Contact information:   [address] [place] [address] [phone]       Prescriptions [facility]: [facility] - given to pt  Means of Discharge: Home via car with friend  Signed: [name], Tiffany Lane 09/28/2013, 11:30 AM

## 2013-09-28 NOTE — Discharge Instructions (Signed)
Follow with Primary MD Tiffany Lane,Tiffany KOTVAN, MD in 3 days   Get CBC, CMP, 2 view Chest X ray checked  by Primary MD next visit.    Activity: As tolerated with Full fall precautions use walker/cane & assistance as needed   Disposition Home    Diet: Heart Healthy    For Heart failure patients - Check your Weight same time everyday, if you gain over 2 pounds, or you develop in leg swelling, experience more shortness of breath or chest pain, call your Primary MD immediately. Follow Cardiac Low Salt Diet and 1.8 lit/day fluid restriction.   On your next visit with her primary care physician please Get Medicines reviewed and adjusted.  Please request your Prim.MD to go over all Hospital Tests and Procedure/Radiological results at the follow up, please get all Hospital records sent to your Prim MD by signing hospital release before you go home.   If you experience worsening of your admission symptoms, develop shortness of breath, life threatening emergency, suicidal or homicidal thoughts you must seek medical attention immediately by calling 911 or calling your MD immediately  if symptoms less severe.  You Must read complete instructions/literature along with all the possible adverse reactions/side effects for all the Medicines you take and that have been prescribed to you. Take any new Medicines after you have completely understood and accpet all the possible adverse reactions/side effects.   Do not drive, operating heavy machinery, perform activities at heights, swimming or participation in water activities or provide baby sitting services if your were admitted for syncope or siezures until you have seen by Primary MD or a Neurologist and advised to do so again.  Do not drive when taking Pain medications.    Do not take more than prescribed Pain, Sleep and Anxiety Medications  Special Instructions: If you have smoked or chewed Tobacco  in the last 2 yrs please stop smoking, stop any regular  Alcohol  and or any Recreational drug use.  Wear Seat belts while driving.   Please note  You were cared for by a hospitalist during your hospital stay. If you have any questions about your discharge medications or the care you received while you were in the hospital after you are discharged, you can call the unit and asked to speak with the hospitalist on call if the hospitalist that took care of you is not available. Once you are discharged, your primary care physician will handle any further medical issues. Please note that NO REFILLS for any discharge medications will be authorized once you are discharged, as it is imperative that you return to your primary care physician (or establish a relationship with a primary care physician if you do not have one) for your aftercare needs so that they can reassess your need for medications and monitor your lab values.                                                       Tiffany Lane was admitted to the Hospital on 09/22/2013 and Discharged  09/28/2013 and should be excused from work/school   for 14-21 days starting 09/22/2013 , may return to work/school without any restrictions.  Call Tiffany RaringPrashant Singh MD, [facility] [phone] with questions.  Tiffany Lane,Tiffany Lane on 09/28/2013,at 8:48 AM  [facility]   Office  [phone]  Your appointment is at 2:30pm, please arrive at least 30 min before your appointment to complete your check in paperwork.  If you are unable to arrive 30 min prior to your appointment time we may have to cancel or reschedule you.  LAPAROSCOPIC SURGERY: POST OP INSTRUCTIONS  1. DIET: Follow a light bland diet the first 24 hours after arrival home, such as soup, liquids, crackers, etc. Be sure to include lots of fluids daily. Avoid fast food or heavy meals as your are more likely to get nauseated. Eat a low fat the next few days after surgery.  2. Take your usually prescribed home medications unless otherwise  directed. 3. PAIN CONTROL:  a. Pain is best controlled by a usual combination of three different methods TOGETHER:  i. Ice/Heat ii. Over the counter pain medication iii. Prescription pain medication b. Most patients will experience some swelling and bruising around the incisions. Ice packs or heating pads (30-60 minutes up to 6 times a day) will help. Use ice for the first few days to help decrease swelling and bruising, then switch to heat to help relax tight/sore spots and speed recovery. Some people prefer to use ice alone, heat alone, alternating between ice & heat. Experiment to what works for you. Swelling and bruising can take several weeks to resolve.  c. It is helpful to take an over-the-counter pain medication regularly for the first few weeks. Choose one of the following that works best for you:  i. Naproxen (Aleve, etc) Two 220mg  tabs twice a day ii. Ibuprofen (Advil, etc) Three 200mg  tabs four times a day (every meal & bedtime) iii. Acetaminophen (Tylenol, etc) 500-650mg  four times a day (every meal & bedtime) d. A prescription for pain medication (such as oxycodone, hydrocodone, etc) should be given to you upon discharge. Take your pain medication as prescribed.  i. If you are having problems/concerns with the prescription medicine (does not control pain, nausea, vomiting, rash, itching, etc), please call us [phone] to see if we need to switch you to a different pain medicine that will work better for you and/or control your side effect better. ii. If you need a refill on your pain medication, please contact your pharmacy. They will contact our office to request authorization. Prescriptions will not be filled after 5 pm or on week-ends. 4. Avoid getting constipated. Between the surgery and the pain medications, it is common to experience some constipation. Increasing fluid intake and taking a fiber supplement (such as Metamucil, Citrucel, FiberCon, MiraLax, etc) 1-2 times a day  regularly will usually help prevent this problem from occurring. A mild laxative (prune juice, Milk of Magnesia, MiraLax, etc) should be taken according to package directions if there are no bowel movements after 48 hours.  5. Watch out for diarrhea. If you have many loose bowel movements, simplify your diet to bland foods & liquids for a few days. Stop any stool softeners and decrease your fiber supplement. Switching to mild anti-diarrheal medications (Kayopectate, Pepto Bismol) can help. If this worsens or does not improve, please call us. 6. Wash / shower every day. You may shower over the dressings as they are waterproof. Continue to shower over incision(s) after the dressing is off. 7. Remove your waterproof bandages 5 days after surgery. You may leave the incision open to air. You may replace a dressing/Band-Aid to cover the incision for comfort if you wish.  8. ACTIVITIES as tolerated:  a. You may resume regular (light) daily activities beginning the next day--such  as daily self-care, walking, climbing stairs--gradually increasing activities as tolerated. If you can walk 30 minutes without difficulty, it is safe to try more intense activity such as jogging, treadmill, bicycling, low-impact aerobics, swimming, etc. b. Save the most intensive and strenuous activity for last such as sit-ups, heavy lifting, contact sports, etc Refrain from any heavy lifting or straining until you are off narcotics for pain control.  c. DO NOT PUSH THROUGH PAIN. Let pain be your guide: If it hurts to do something, don't do it. Pain is your body warning you to avoid that activity for another week until the pain goes down. d. You may drive when you are no longer taking prescription pain medication, you can comfortably wear a seatbelt, and you can safely maneuver your car and apply brakes. e. Bonita Quin may have sexual intercourse when it is comfortable.  9. FOLLOW UP in our office  a. Please call [facility] at [phone] to set up  an appointment to see your surgeon in the office for a follow-up appointment approximately 2-3 weeks after your surgery. b. Make sure that you call for this appointment the day you arrive home to insure a convenient appointment time.      10. IF YOU HAVE DISABILITY OR FAMILY LEAVE FORMS, BRING THEM TO THE               OFFICE FOR PROCESSING.   WHEN TO CALL us [phone]:  1. Poor pain control 2. Reactions / problems with new medications (rash/itching, nausea, etc)  3. Fever over 101.5 F (38.5 C) 4. Inability to urinate 5. Nausea and/or vomiting 6. Worsening swelling or bruising 7. Continued bleeding from incision. 8. Increased pain, redness, or drainage from the incision  The clinic staff is available to answer your questions during regular business hours (8:30am-5pm). Please dont hesitate to call and ask to speak to one of our nurses for clinical concerns.  If you have a medical emergency, go to the nearest emergency room or call 911.  A surgeon from [facility] is always on call at the [facility] [place]  [address], [address], [place], [place] [address] ?  MAIN: [phone] ? TOLL FREE: [phone] ?  FAX [phone]  [email or web address]

## 2013-09-29 LAB — GLUCOSE, CAPILLARY: GLUCOSE-CAPILLARY: 110 mg/dL — AB (ref 70–99)

## 2013-10-01 ENCOUNTER — Telehealth: Payer: Self-pay | Admitting: Internal Medicine

## 2013-10-01 NOTE — Telephone Encounter (Addendum)
Pt was discharge from [facility] on 09-28-13 and per discharge instruction pt needs appt in 3 day. Pt has not seen md since around 2009

## 2013-10-01 NOTE — Telephone Encounter (Signed)
Per WP, okay to see as new patient in 2 weeks (30 minute appt).  Please mail new patient paper work to be filled out before being seen.   Needs to follow up with surgeon for post hospital. [facility] with [facility] establishing with a new provider if she chooses.

## 2013-10-01 NOTE — Telephone Encounter (Signed)
Not a patient here.  Not seen since 2009

## 2013-10-01 NOTE — Telephone Encounter (Signed)
Per Molli HazardMatthew and Dr. Fabian SharpPanosh ok for pt to be seen tomorrow for a hospital follow up.  Pt is aware that she needs to schedule a new pt appt with Dr. Fabian SharpPanosh in 2 weeks.  Pt verbalized understanding and is aware of her appt on 10/02/13 at 3:15 pm.

## 2013-10-01 NOTE — Telephone Encounter (Signed)
Patient Information:  Caller Name: Corrie DandyMary  Phone: [phone]  Patient: Tiffany Lane, Tiffany Lane  Gender: Female  DOB: 11/23/1955  Age: 58 Years  PCP: Berniece AndreasPanosh, Wanda (Family Practice)  Office Follow Up:  Does the office need to follow up with this patient?: Yes  Instructions For The Office: This pt. needs an appt. for Hospital Follow-Up after her pancreatitis and GB surgery. Last saw Dr. Fabian SharpPanosh in 2009 but is willing to see anyone.  RN Note:  Pt. reassured probably the GB surgery that is causing her diarrhea after each meal. Pt. will watch her diet and try Probiotics. Had antibiotics (Rocephin) in the hospital, but not really having any abdominal pain. Needs a Hospital Follow-up per the Surgeon with her PCP and some blood work done.  Symptoms  Reason For Call & Symptoms: Hop[facility]ized from 09/22/13-09/28/13 with pancreatitis and emergency GB surgery. Now having very watery stools starting 09/30/13. Wonders if these are normal after the surgery.  Reviewed Health History In EMR: Yes  Reviewed Medications In EMR: Yes  Reviewed Allergies In EMR: Yes  Reviewed Surgeries / Procedures: Yes  Date of Onset of Symptoms: 09/30/2013  Guideline(s) Used:  Diarrhea  Disposition Per Guideline:   Callback by PCP Today  Reason For Disposition Reached:   Recent antibiotic therapy (i.e., within last 2 months)  Advice Given:  Call Back If:  Signs of dehydration occur (e.g., no urine for more than 12 hours, very dry mouth, lightheaded, etc.)  Diarrhea lasts over 7 days  You become worse.  Patient Will Follow Care Advice:  YES

## 2013-10-02 ENCOUNTER — Ambulatory Visit (INDEPENDENT_AMBULATORY_CARE_PROVIDER_SITE_OTHER): Payer: 59 | Admitting: Physician Assistant

## 2013-10-02 ENCOUNTER — Encounter: Payer: Self-pay | Admitting: Physician Assistant

## 2013-10-02 ENCOUNTER — Ambulatory Visit (INDEPENDENT_AMBULATORY_CARE_PROVIDER_SITE_OTHER)
Admission: RE | Admit: 2013-10-02 | Discharge: 2013-10-02 | Disposition: A | Discharge status: Home / Self Care | Payer: 59 | Source: Ambulatory Visit | Attending: Physician Assistant | Admitting: Physician Assistant

## 2013-10-02 VITALS — BP 112/82 | HR 90 | Temp 98.0°F | Resp 18 | Wt 221.0 lb

## 2013-10-02 DIAGNOSIS — K8020 Calculus of gallbladder without cholecystitis without obstruction: Secondary | ICD-10-CM

## 2013-10-02 DIAGNOSIS — Z8679 Personal history of other diseases of the circulatory system: Secondary | ICD-10-CM

## 2013-10-02 DIAGNOSIS — Z9049 Acquired absence of other specified parts of digestive tract: Secondary | ICD-10-CM

## 2013-10-02 DIAGNOSIS — K851 Biliary acute pancreatitis without necrosis or infection: Secondary | ICD-10-CM

## 2013-10-02 DIAGNOSIS — Z9889 Other specified postprocedural states: Secondary | ICD-10-CM

## 2013-10-02 DIAGNOSIS — R197 Diarrhea, unspecified: Secondary | ICD-10-CM

## 2013-10-02 DIAGNOSIS — K859 Acute pancreatitis without necrosis or infection, unspecified: Secondary | ICD-10-CM

## 2013-10-02 DIAGNOSIS — L259 Unspecified contact dermatitis, unspecified cause: Secondary | ICD-10-CM

## 2013-10-02 DIAGNOSIS — I10 Essential (primary) hypertension: Secondary | ICD-10-CM

## 2013-10-02 NOTE — Patient Instructions (Addendum)
We'll obtain lab work today and call you with the results when available.  We'll also [place] [facility] [place] [facility] and call you with the results.  Continue taking all your current medications as directed.  Continue using cold compress to help her itching symptoms from your resolving rash.   Continue your plan to follow up with surgery as directed.  Continue to focus on a low-fat diet, and try to get adequate fiber to try and calm your bowels. The brat diet is usually very successful at calming diarrhea symptoms. (Bananas, rice, applesauce, toast)  Keep plan to follow up to re-establish care with Dr. Fabian SharpPanosh.  If emergency symptoms discussed during visit developed, seek medical attention immediately.  Followup as needed, or for worsening or persistent symptoms despite treatment.     Acute Pancreatitis Acute pancreatitis is a disease in which the pancreas becomes suddenly irritated (inflamed). The pancreas is a large gland behind your stomach. The pancreas makes enzymes that help digest food. The pancreas also makes 2 hormones that help control your blood sugar. Acute pancreatitis happens when the enzymes attack and damage the pancreas. Most attacks last a couple of days and can cause serious problems. HOME CARE  Follow your doctor's diet instructions. You may need to avoid alcohol and limit fat in your diet.  Eat small meals often.  Drink enough fluids to keep your pee (urine) clear or pale yellow.  Only take medicines as told by your doctor.  Avoid drinking alcohol if it caused your disease.  Do not smoke.  Get plenty of rest.  Check your blood sugar at home as told by your doctor.  Keep all doctor visits as told. GET HELP IF:  You do not get better as quickly as expected.  You have new or worsening symptoms.  You have lasting pain, weakness, or feel sick to your stomach (nauseous).  You get better and then have another pain attack. GET HELP RIGHT AWAY  IF:   You are unable to eat or keep fluids down.  Your pain becomes severe.  You have a fever or lasting symptoms for more than 2 to 3 days.  You have a fever and your symptoms suddenly get worse.  Your skin or the white part of your eyes turn yellow (jaundice).  You throw up (vomit).  You feel dizzy, or you pass out (faint).  Your blood sugar is high (over 300 mg/dL). MAKE SURE YOU:   Understand these instructions.  Will watch your condition.  Will get help right away if you are not doing well or get worse. Document Released: 07/18/2007 Document Revised: 06/15/2013 Document Reviewed: 05/10/2011 [facility] Patient Information 2015 [place]. This information is not intended to replace advice given to you by your health care provider. Make sure you discuss any questions you have with your health care provider. Food Choices to Help Relieve Diarrhea When you have diarrhea, the foods you eat and your eating habits are very important. Choosing the right foods and drinks can help relieve diarrhea. Also, because diarrhea can last up to 7 days, you need to replace lost fluids and electrolytes (such as sodium, potassium, and chloride) in order to help prevent dehydration.  WHAT GENERAL GUIDELINES DO I NEED TO FOLLOW?  Slowly drink 1 cup (8 oz) of fluid for each episode of diarrhea. If you are getting enough fluid, your urine will be clear or pale yellow.  Eat starchy foods. Some good choices include white rice, white toast, pasta,  low-fiber cereal, baked potatoes (without the skin), saltine crackers, and bagels.  Avoid large servings of any cooked vegetables.  Limit fruit to two servings per day. A serving is  cup or 1 small piece.  Choose foods with less than 2 g of fiber per serving.  Limit fats to less than 8 tsp (38 g) per day.  Avoid fried foods.  Eat foods that have probiotics in them. Probiotics can be found in certain dairy products.  Avoid foods and beverages that  may increase the speed at which food moves through the stomach and intestines (gastrointestinal tract). Things to avoid include:  High-fiber foods, such as dried fruit, raw fruits and vegetables, nuts, seeds, and whole grain foods.  Spicy foods and high-fat foods.  Foods and beverages sweetened with high-fructose corn syrup, honey, or sugar alcohols such as xylitol, sorbitol, and mannitol. WHAT FOODS ARE RECOMMENDED? Grains White rice. White, [place], or pita breads (fresh or toasted), including plain rolls, buns, or bagels. White pasta. Saltine, soda, or graham crackers. Pretzels. Low-fiber cereal. Cooked cereals made with water (such as cornmeal, farina, or cream cereals). Plain muffins. Matzo. Melba toast. Zwieback.  Vegetables Potatoes (without the skin). Strained tomato and vegetable juices. Most well-cooked and canned vegetables without seeds. Tender lettuce. Fruits Cooked or canned applesauce, apricots, cherries, fruit cocktail, grapefruit, peaches, pears, or plums. Fresh bananas, apples without skin, cherries, grapes, cantaloupe, grapefruit, peaches, oranges, or plums.  Meat and Other Protein Products Baked or boiled chicken. Eggs. Tofu. Fish. Seafood. Smooth peanut butter. Ground or well-cooked tender beef, ham, veal, lamb, pork, or poultry.  Dairy Plain yogurt, kefir, and unsweetened liquid yogurt. Lactose-free milk, buttermilk, or soy milk. Plain hard cheese. Beverages Sport drinks. Clear broths. Diluted fruit juices (except prune). Regular, caffeine-free sodas such as ginger ale. Water. Decaffeinated teas. Oral rehydration solutions. Sugar-free beverages not sweetened with sugar alcohols. Other Bouillon, broth, or soups made from recommended foods.  The items listed above may not be a complete list of recommended foods or beverages. Contact your dietitian for more options. WHAT FOODS ARE NOT RECOMMENDED? Grains Whole grain, whole wheat, bran, or rye breads, rolls, pastas,  crackers, and cereals. Wild or brown rice. Cereals that contain more than 2 g of fiber per serving. Corn tortillas or taco shells. Cooked or dry oatmeal. Granola. Popcorn. Vegetables Raw vegetables. Cabbage, broccoli, Brussels sprouts, artichokes, baked beans, beet greens, corn, kale, legumes, peas, sweet potatoes, and yams. Potato skins. Cooked spinach and cabbage. Fruits Dried fruit, including raisins and dates. Raw fruits. Stewed or dried prunes. Fresh apples with skin, apricots, mangoes, pears, raspberries, and strawberries.  Meat and Other Protein Products Chunky peanut butter. Nuts and seeds. Beans and lentils. Tomasa Blase.  Dairy High-fat cheeses. Milk, chocolate milk, and beverages made with milk, such as milk shakes. Cream. Ice cream. Sweets and Desserts Sweet rolls, doughnuts, and sweet breads. Pancakes and waffles. Fats and Oils Butter. Cream sauces. Margarine. Salad oils. Plain salad dressings. Olives. Avocados.  Beverages Caffeinated beverages (such as coffee, tea, soda, or energy drinks). Alcoholic beverages. Fruit juices with pulp. Prune juice. Soft drinks sweetened with high-fructose corn syrup or sugar alcohols. Other Coconut. Hot sauce. Chili powder. Mayonnaise. Gravy. Cream-based or milk-based soups.  The items listed above may not be a complete list of foods and beverages to avoid. Contact your dietitian for more information. WHAT SHOULD I DO IF I BECOME DEHYDRATED? Diarrhea can sometimes lead to dehydration. Signs of dehydration include dark urine and dry mouth and skin. If you think  you are dehydrated, you should rehydrate with an oral rehydration solution. These solutions can be purchased at pharmacies, retail stores, or online.  Drink -1 cup (120-240 mL) of oral rehydration solution each time you have an episode of diarrhea. If drinking this amount makes your diarrhea worse, try drinking smaller amounts more often. For example, drink 1-3 tsp (5-15 mL) every 5-10 minutes.  A  general rule for staying hydrated is to drink 1-2 L of fluid per day. Talk to your health care provider about the specific amount you should be drinking each day. Drink enough fluids to keep your urine clear or pale yellow. Document Released: 04/21/2003 Document Revised: 02/03/2013 Document Reviewed: 12/22/2012 [facility] Patient Information 2015 [place], [place]. This information is not intended to replace advice given to you by your health care provider. Make sure you discuss any questions you have with your health care provider. Contact Dermatitis Contact dermatitis is a rash that happens when something touches the skin. You touched something that irritates your skin, or you have allergies to something you touched. HOME CARE   Avoid the thing that caused your rash.  Keep your rash away from hot water, soap, sunlight, chemicals, and other things that might bother it.  Do not scratch your rash.  You can take cool baths to help stop itching.  Only take medicine as told by your doctor.  Keep all doctor visits as told. GET HELP RIGHT AWAY IF:   Your rash is not better after 3 days.  Your rash gets worse.  Your rash is puffy (swollen), tender, red, sore, or warm.  You have problems with your medicine. MAKE SURE YOU:   Understand these instructions.  Will watch your condition.  Will get help right away if you are not doing well or get worse. Document Released: 11/26/2008 Document Revised: 04/23/2011 Document Reviewed: 07/04/2010 [facility] Patient Information 2015 [place], [place]. This information is not intended to replace advice given to you by your health care provider. Make sure you discuss any questions you have with your health care provider.

## 2013-10-02 NOTE — Progress Notes (Signed)
Subjective:    Patient ID: Tiffany Lane, female    DOB: 02/05/1956, 58 y.o.   MRN: [id number]  HPI Patient is a 58 y.o. female presenting for Hospital Discharge Follow up.  HPI from the history and physical done on the day of admission  Tiffany Lane is a 58 y.o. female presents with abdominal pain. Patient states tha she has noted the pain for about 3 days now. She has had indigestion and epigastric pain. Patient states that she had no fevers noted. She states pain does not appear to radiate anywhere else. She initially tried to take some of her famotidine but this did not seem to help. She states that she has never had any pancreatic issues in the past. Patient states that she does not drink and does not smoke. She did have diarrhea but no blood in her stools.  Hospital Course  1. Acute gallstone pancreatitis with gallbladder thickening - Gen. surgery following, improved clinically, status post laparoscopic cholecystectomy on 09/25/2013, antibiotics stopped, diet advanced. She is much better with pancreatitis resolved, tolerating diet and eager to go home cleared by surgery for discharge as well.  2.Allergic rash - has some dermographism , likely from adhesive tape, tape removed, rash improving with Benadryl and topical steroid cream.  3. Mild hypertension. Patient counseled on weight reduction, we'll request PCP to monitor medication if required.    The Pt presents today states that she feels overall better. She has been eating a very low fat diet, [place] and chicken, and carbs. No dairy products. She reports having many episodes of diarrhea following being discharged from the hospital. She states that her bowel movements were very liquid in nature, and no blood was visible. Yesterday she starting taking a probiotic, and reports an immediate regulation of her BMs. She states that today she has had only 1 BM, and while it was loose, it was formed and no longer liquid. She was provided Norco  for pain control, but has not had to use this, and her pain is manageable with ibuprofen alone. Her Hospitalist had sent her home with recommendations for a CBC, CMP, and a 2 view chest Xray at follow up. Patient denies fevers, chills, nausea, vomiting, shortness of breath, chest pain, headache, syncope.  For her rash, she has been using cold packs, and hasn't had to do this very often. She believes that this has improved greatly since coming home from the hospital. She deneis any increased warmth, or tenderness to palpation. She says this occasionally itches, but is much more tolerable, and not persistently itchy.  Her BP seems to have improved. She reports no elevated BPS at home, 120s/80s. She does not take anything for HTN.   Review of Systems As per HPI and are otherwise negative.     Past Medical History  Diagnosis Date  . Migraine     History   Social History  . Marital Status: Single    Spouse Name: N/A    Number of Children: N/A  . Years of Education: N/A   Occupational History  . Not on file.   Social History Main Topics  . Smoking status: Never Smoker   . Smokeless tobacco: Never Used  . Alcohol Use: No  . Drug Use: No  . Sexual Activity: No   Other Topics Concern  . Not on file   Social History Narrative  . No narrative on file    Past Surgical History  Procedure Laterality Date  . Myringotomy  with tube placement    . Cholecystectomy N/A 09/25/2013    Procedure: LAPAROSCOPIC CHOLECYSTECTOMY WITH INTRAOPERATIVE CHOLANGIOGRAM, REPAIR OF UMBILICAL HERNIA;  Surgeon: Kandis Cocking, MD;  Location: [facility];  Service: General;  Laterality: N/A;    Family History  Problem Relation Age of Onset  . Other Mother   . Other Father     Allergies  Allergen Reactions  . Adhesive [Tape] Hives  . Blistex [Phenol]     Blisters lip  . Iodine Hives  . Lip Balm [Lip Medex] Hives    Allergic to Carmex  . Prednisone     Current Outpatient Prescriptions on File Prior  to Visit  Medication Sig Dispense Refill  . FAMOTIDINE PO Take 1 tablet by mouth daily as needed (reflux.).      Marland Kitchen ibuprofen (ADVIL,MOTRIN) 200 MG tablet Take 3 tablets (600 mg total) by mouth every 8 (eight) hours as needed (pain.).  30 tablet  0   No current facility-administered medications on file prior to visit.    EXAM: BP 112/82  Pulse 90  Temp(Src) 98 F (36.7 C) (Oral)  Resp 18  Wt 221 lb (100.245 kg)  SpO2 98%     Objective:   Physical Exam  Nursing note and vitals reviewed. Constitutional: She is oriented to person, place, and time. She appears well-developed and well-nourished. No distress.  HENT:  Head: Normocephalic and atraumatic.  Eyes: Conjunctivae and EOM are normal. Pupils are equal, round, and reactive to light.  Cardiovascular: Normal rate, regular rhythm, normal heart sounds and intact distal pulses.   Pulmonary/Chest: Effort normal and breath sounds normal. No respiratory distress. She has no wheezes. She has no rales. She exhibits no tenderness.  Abdominal: Soft. Bowel sounds are normal. There is no tenderness.  Musculoskeletal: Normal range of motion. She exhibits edema (bilateral less than 1+ pitting edema.). She exhibits no tenderness.  Neurological: She is alert and oriented to person, place, and time.  Skin: Skin is warm and dry. Rash noted. She is not diaphoretic. No pallor.  Diffuse rash covering areas of her abdomen and legs. Some are square in shape (ekg leads). No warmth, no ttp, no fluctuance.  Visible surgical wounds on abdomen, still covered in dermabond. Appear to be healing well.   Psychiatric: She has a normal mood and affect. Her behavior is normal. Judgment and thought content normal.     Lab Results  Component Value Date   WBC 7.6 09/27/2013   HGB 12.4 09/27/2013   HCT 37.6 09/27/2013   PLT 262 09/27/2013   GLUCOSE 106* 09/27/2013   CHOL 179 09/23/2013   TRIG 79 09/23/2013   HDL 50 09/23/2013   LDLDIRECT 155.5 06/25/2007   LDLCALC  113* 09/23/2013   ALT 41* 09/27/2013   AST 26 09/27/2013   NA 140 09/27/2013   K 4.1 09/27/2013   CL 103 09/27/2013   CREATININE 0.78 09/27/2013   BUN 7 09/27/2013   CO2 28 09/27/2013   TSH 1.330 09/23/2013   HGBA1C 5.8* 09/23/2013        Assessment & Plan:  Kaitlin was seen today for hospitalization follow-up.  Diagnoses and associated orders for this visit:  Contact dermatitis Comments: Resolving. Pt has not had any more acute flares, manageable with ice packs. Watchful waiting.  Acute gallstone pancreatitis Comments: Resolved. Will obtain CBC, CMP. - CBC with Differential - Basic Metabolic Panel - Hepatic Function Panel - Lipase  S/P laparoscopic cholecystectomy Comments: Surgical wounds appear to be healing well. Will  obtain 2 view CXR per recomendation. Keep f/u with surgery. - CBC with Differential - Basic Metabolic Panel - Hepatic Function Panel - Lipase - DG Chest 2 View; Future  History of high blood pressure Comments: Normotensive today, and at home per pt. Not currently on treatment for HTN. Watchful waiting.  Diarrhea Comments: s/p cholecystectomy. Resolving. Only 1 stool today, formed. Continue BRAT type diet. Watchful waiting.    Return precautions provided, and patient handout on acute pancreatitis, contact dermatitis, diet diarrhea.  Plan to follow up as needed, or for worsening or persistent symptoms despite treatment.  Patient Instructions  We'll obtain lab work today and call you with the results when available.  We'll also obtain an x-ray today at the [place] office and call you with the results.  Continue taking all your current medications as directed.  Continue using cold compress to help her itching symptoms from your resolving rash.   Continue your plan to follow up with surgery as directed.  Continue to focus on a low-fat diet, and try to get adequate fiber to try and calm your bowels. The brat diet is usually very successful at calming diarrhea  symptoms. (Bananas, rice, applesauce, toast)  Keep plan to follow up to re-establish care with Dr. Fabian Sharp.  If emergency symptoms discussed during visit developed, seek medical attention immediately.  Followup as needed, or for worsening or persistent symptoms despite treatment.

## 2013-10-02 NOTE — Progress Notes (Signed)
Pre visit review using our clinic review tool, if applicable. No additional management support is needed unless otherwise documented below in the visit note. 

## 2013-10-03 LAB — BASIC METABOLIC PANEL
BUN: 17 mg/dL (ref 6–23)
CALCIUM: 9.3 mg/dL (ref 8.4–10.5)
CO2: 29 mEq/L (ref 19–32)
Chloride: 102 mEq/L (ref 96–112)
Creatinine, Ser: 0.9 mg/dL (ref 0.4–1.2)
GFR: 64.59 mL/min (ref >60.00)
Glucose, Bld: 94 mg/dL (ref 70–99)
Potassium: 4.3 mEq/L (ref 3.5–5.1)
SODIUM: 139 meq/L (ref 135–145)

## 2013-10-03 LAB — HEPATIC FUNCTION PANEL
ALBUMIN: 3.7 g/dL (ref 3.5–5.2)
ALK PHOS: 76 U/L (ref 39–117)
ALT: 32 U/L (ref 0–35)
AST: 22 U/L (ref 0–37)
BILIRUBIN DIRECT: 0.1 mg/dL (ref 0.0–0.3)
Total Bilirubin: 0.3 mg/dL (ref 0.2–1.2)
Total Protein: 7 g/dL (ref 6.0–8.3)

## 2013-10-03 LAB — CBC WITH DIFFERENTIAL/PLATELET
Basophils Absolute: 0 10*3/uL (ref 0.0–0.1)
Basophils Relative: 0.5 % (ref 0.0–3.0)
Eosinophils Absolute: 0.5 10*3/uL (ref 0.0–0.7)
Eosinophils Relative: 6.2 % — ABNORMAL HIGH (ref 0.0–5.0)
HEMATOCRIT: 42.4 % (ref 36.0–46.0)
Hemoglobin: 14.2 g/dL (ref 12.0–15.0)
Lymphocytes Relative: 18.7 % (ref 12.0–46.0)
Lymphs Abs: 1.5 10*3/uL (ref 0.7–4.0)
MCHC: 33.5 g/dL (ref 30.0–36.0)
MCV: 89.3 fl (ref 78.0–100.0)
MONO ABS: 0.5 10*3/uL (ref 0.1–1.0)
Monocytes Relative: 6 % (ref 3.0–12.0)
Neutro Abs: 5.5 10*3/uL (ref 1.4–7.7)
Neutrophils Relative %: 68.6 % (ref 43.0–77.0)
PLATELETS: 387 10*3/uL (ref 150.0–400.0)
RBC: 4.74 Mil/uL (ref 3.87–5.11)
RDW: 13.8 % (ref 11.5–15.5)
WBC: 8 10*3/uL (ref 4.0–10.5)

## 2013-10-03 LAB — LIPASE: Lipase: 83 U/L — ABNORMAL HIGH (ref 11.0–59.0)

## 2013-10-20 ENCOUNTER — Encounter (INDEPENDENT_AMBULATORY_CARE_PROVIDER_SITE_OTHER): Payer: 59

## 2015-11-05 IMAGING — US US ABDOMEN COMPLETE
1 series · 13 of 25 positions shown · non-contrast
Comparison: None.

CLINICAL DATA: Nausea, vomiting, and abdominal pain

EXAM:
ULTRASOUND ABDOMEN COMPLETE

[Series 1: us abdomen complete · 0.22mm/px · 13 of 72 slices shown]
[im 1/72]
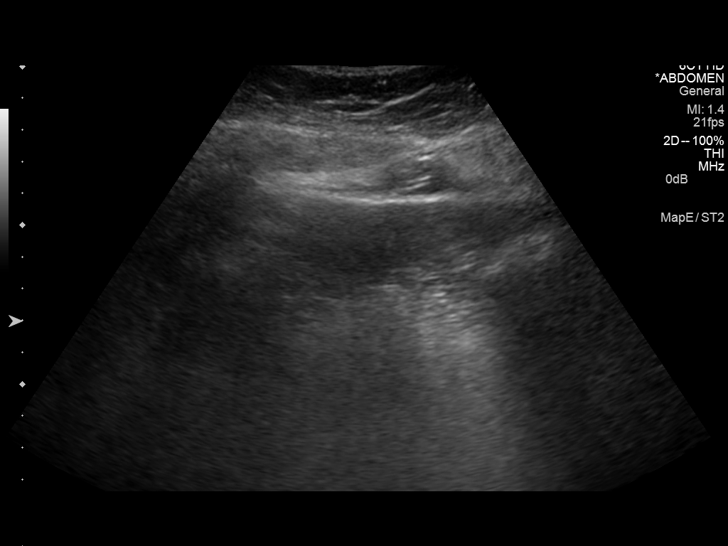
[im 6/72]
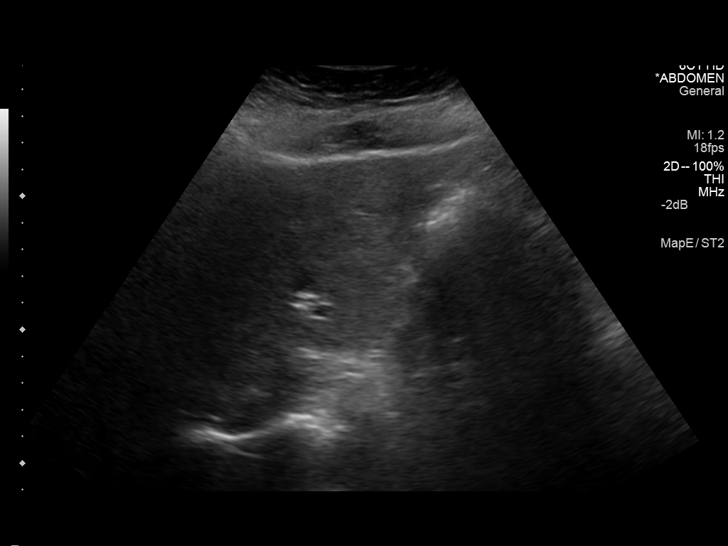
[im 12/72]
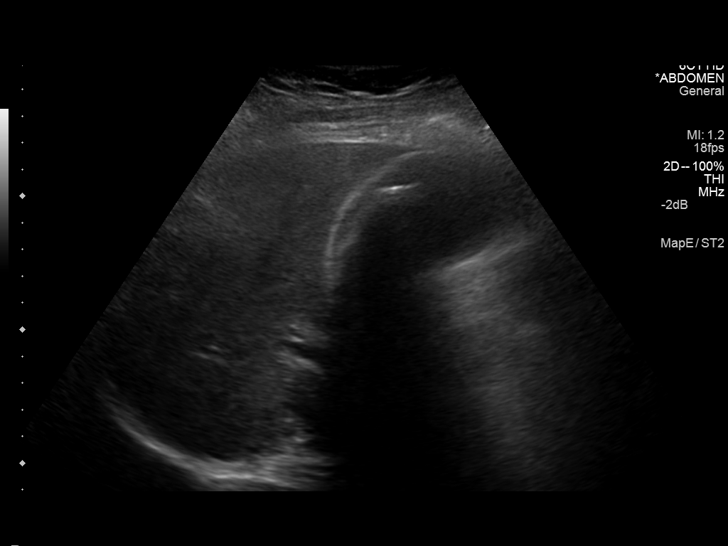
[im 18/72]
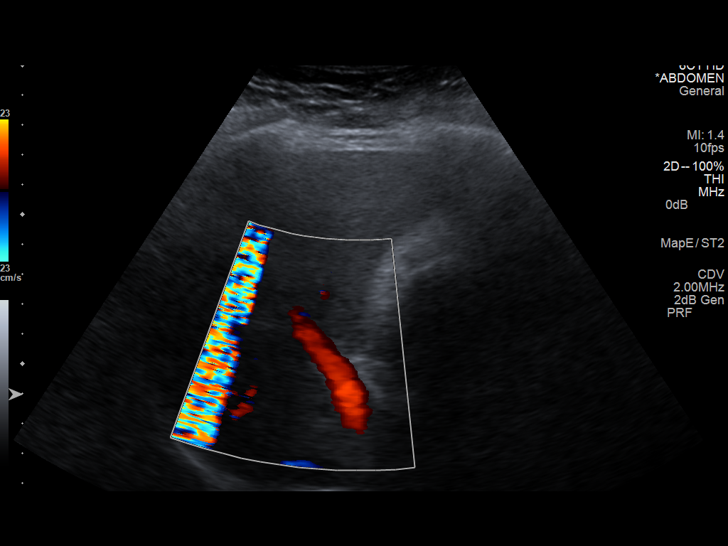
[im 24/72]
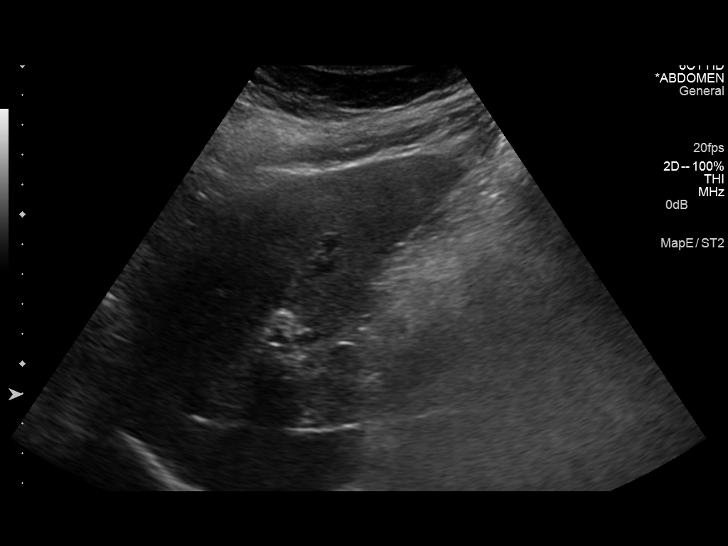
[im 30/72]
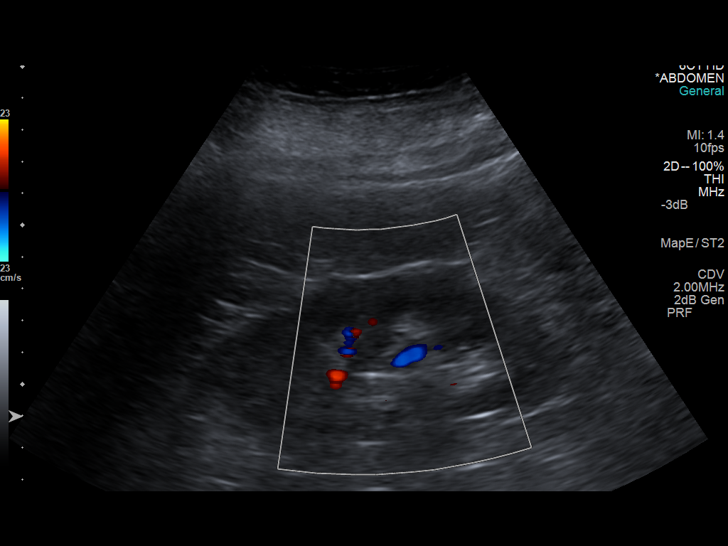
[im 36/72]
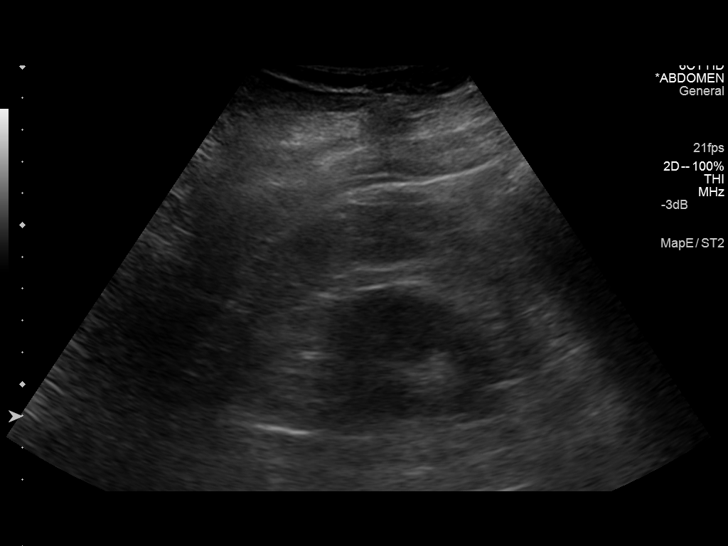
[im 42/72]
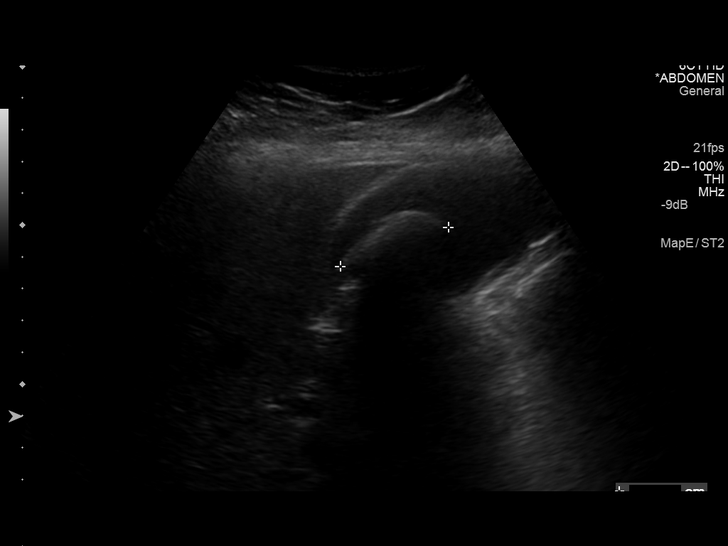
[im 48/72]
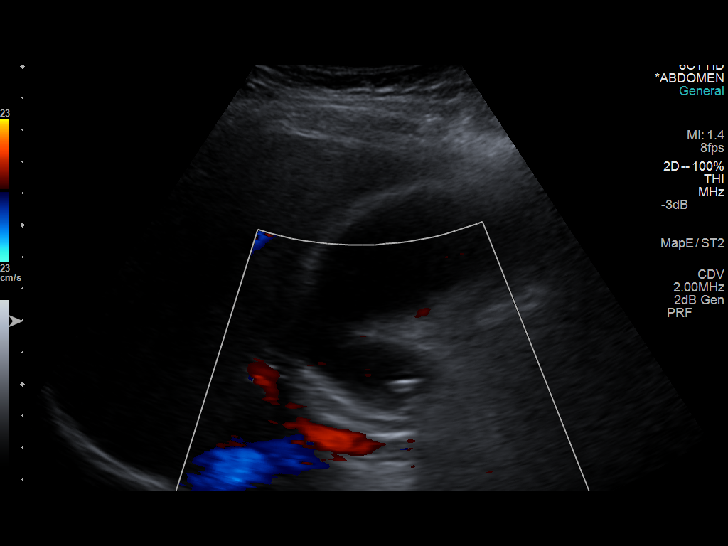
[im 54/72]
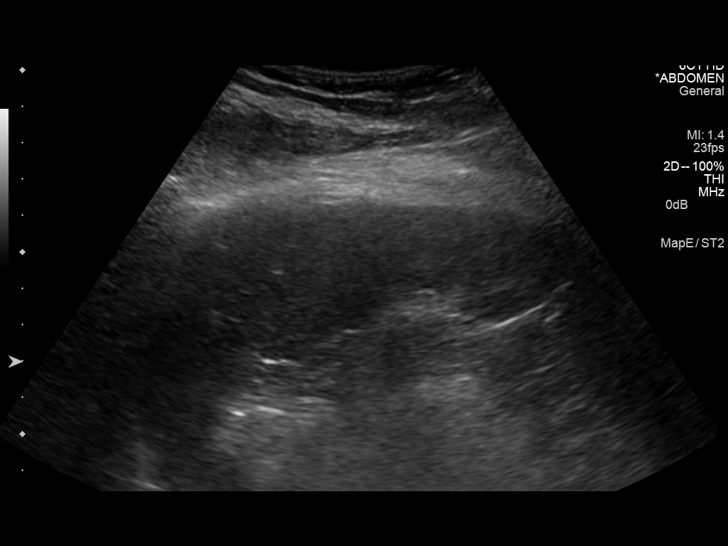
[im 60/72]
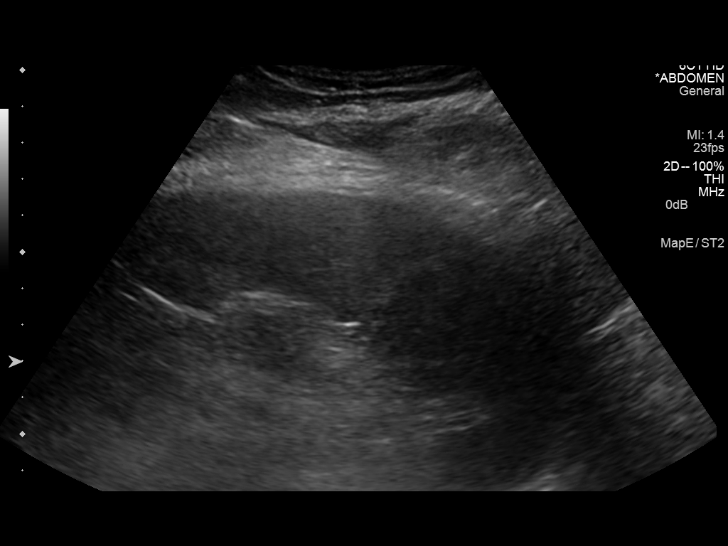
[im 66/72]
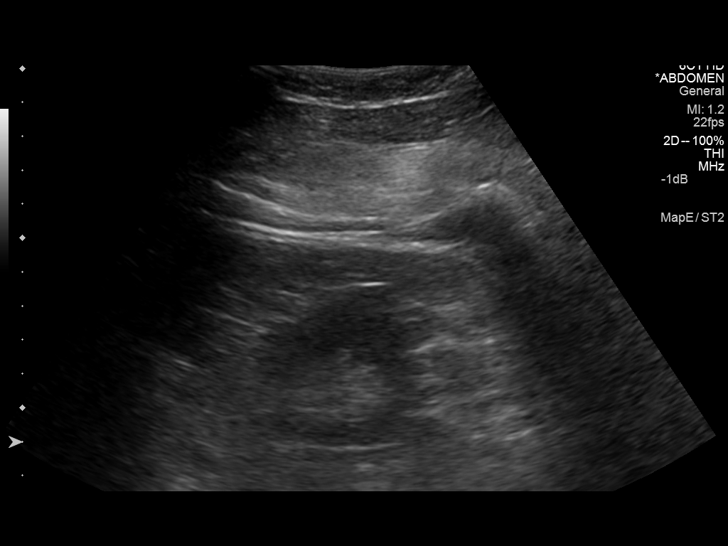
[im 72/72]
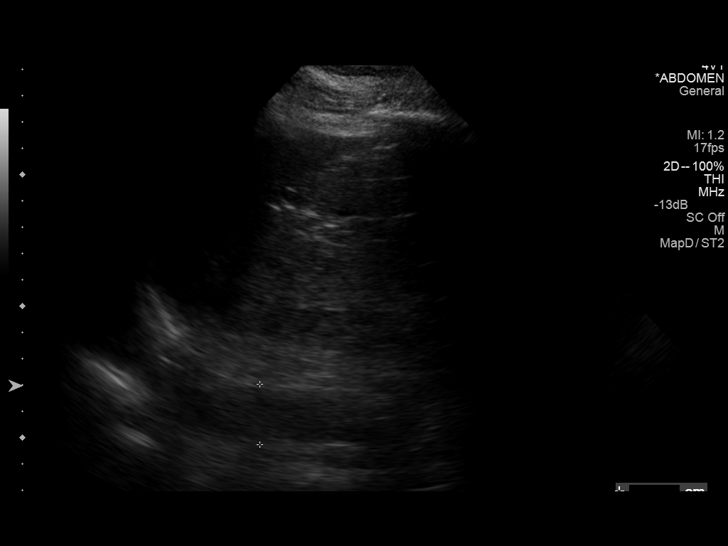

[13 of 25 positions shown; findings below may reference images not displayed]

FINDINGS: Gallbladder:

The gallbladder is adequately distended but the lumen is occupied by
multiple stones exhibiting a wall echo shadow contour. There is mild
gallbladder wall thickening to 4.2 mm. There are stones in the
gallbladder neck. There is no positive sonographic Murphy's sign.

Common bile duct:

Diameter:  4.3 mm.

Liver:

The liver demonstrates mildly increased echotexture consistent with
fatty infiltration. There is no focal mass or ductal dilation.

IVC:

No abnormality visualized.

Pancreas:

The pancreas was obscured by bowel gas.

Spleen:

Size and appearance within normal limits.

Right Kidney:

Length: 11.4 cm. Echogenicity within normal limits. No mass or
hydronephrosis visualized.

Left Kidney:

Length: 12.2 cm. Echogenicity within normal limits. No mass or
hydronephrosis visualized.

Abdominal aorta:

Evaluation of the abdominal aorta is limited due to bowel gas and
the patient's body habitus.

Other findings:  No ascites is demonstrated.
IMPRESSION: 1. There are multiple gallstones with some impacted in the
gallbladder neck. There is mild gallbladder wall thickening but no
positive sonographic Murphy's sign.
2. The common bile duct is normal.
3. There are likely fatty infiltrated changes of the liver.
4. The pancreas was obscured by bowel gas.

## 2015-11-08 IMAGING — RF DG CHOLANGIOGRAM OPERATIVE
1 series · 8 of 8 positions shown · non-contrast
Comparison: Abdominal CT 09/22/2013

CLINICAL DATA: Cholelithiasis.

EXAM:
INTRAOPERATIVE CHOLANGIOGRAM
TECHNIQUE: Cholangiographic images from the C-arm fluoroscopic device were
submitted for interpretation post-operatively. Please see the
procedural report for the amount of contrast and the fluoroscopy
time utilized.

[Series 1: run · 2 acquisitions, 8 frames shown]
[im 1/2]
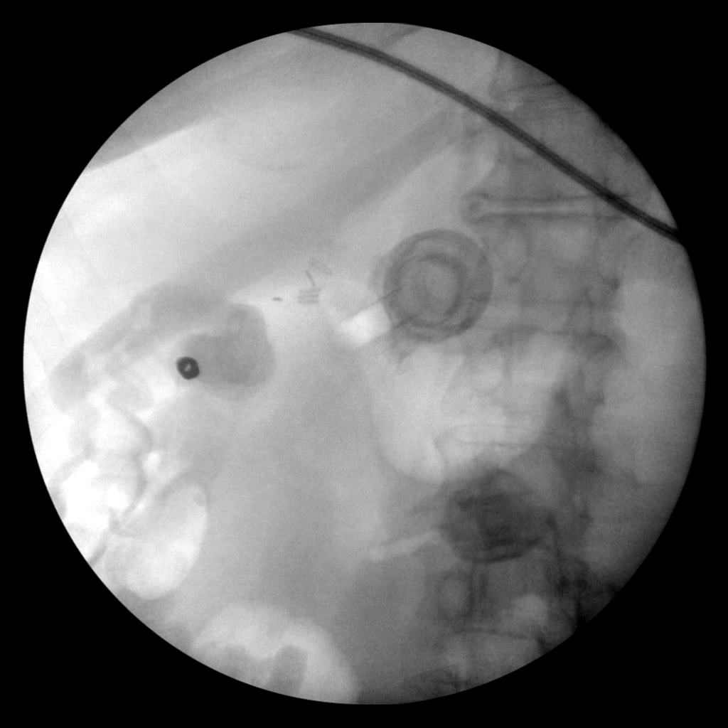
[im 1/2]
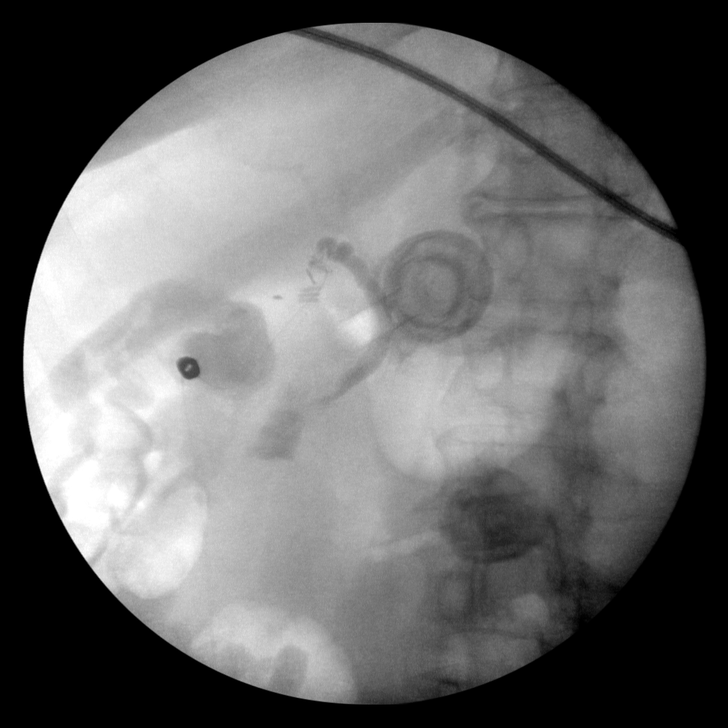
[im 1/2]
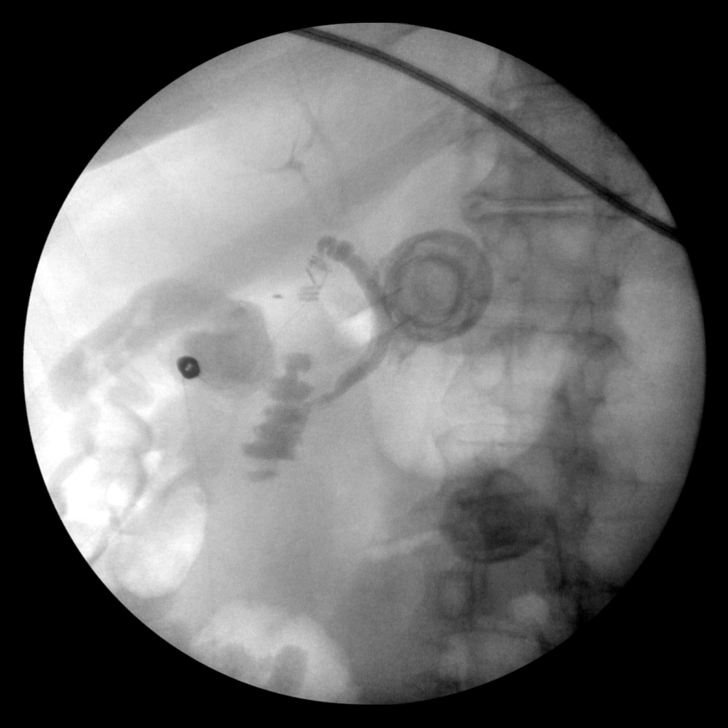
[im 1/2]
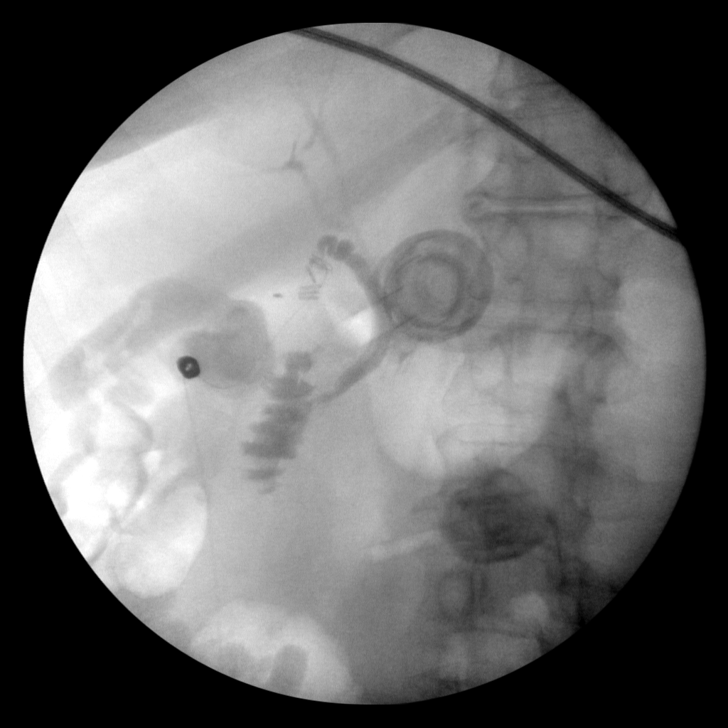
[im 2/2]
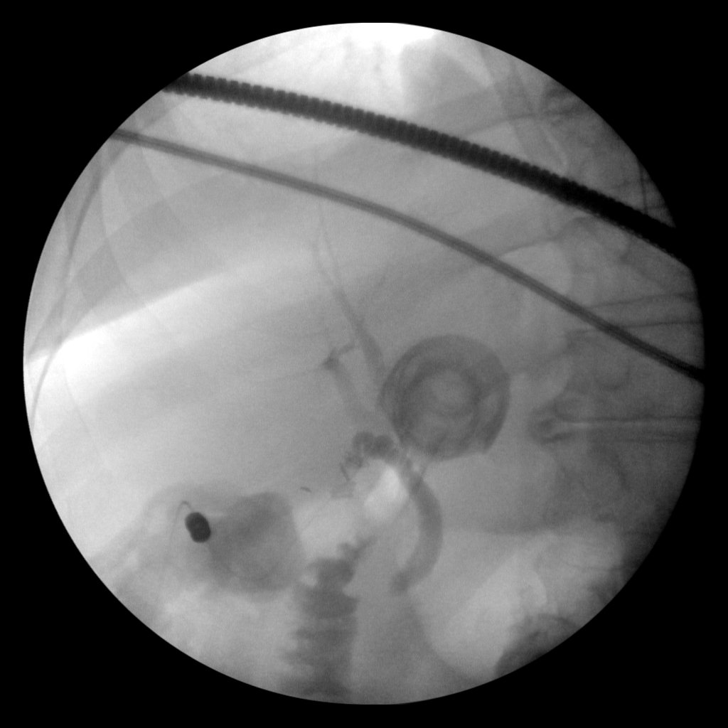
[im 2/2]
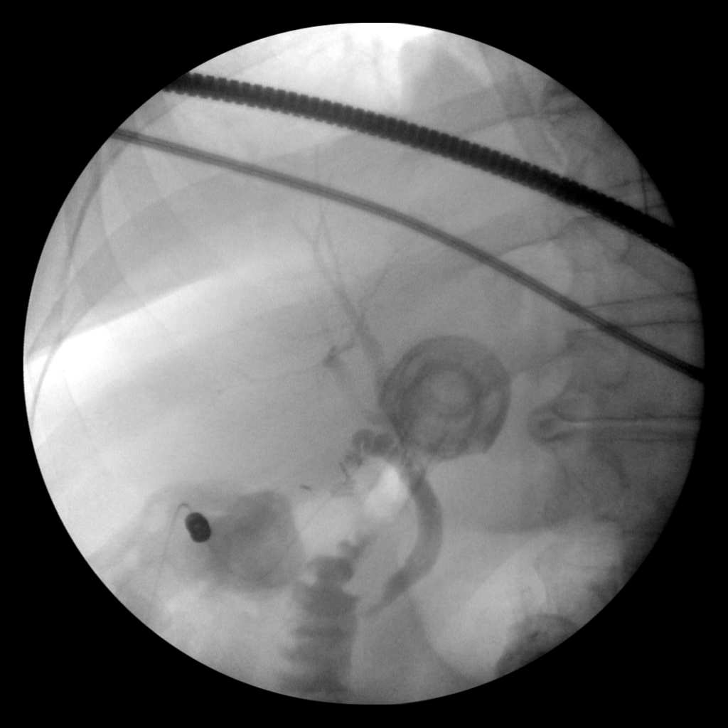
[im 2/2]
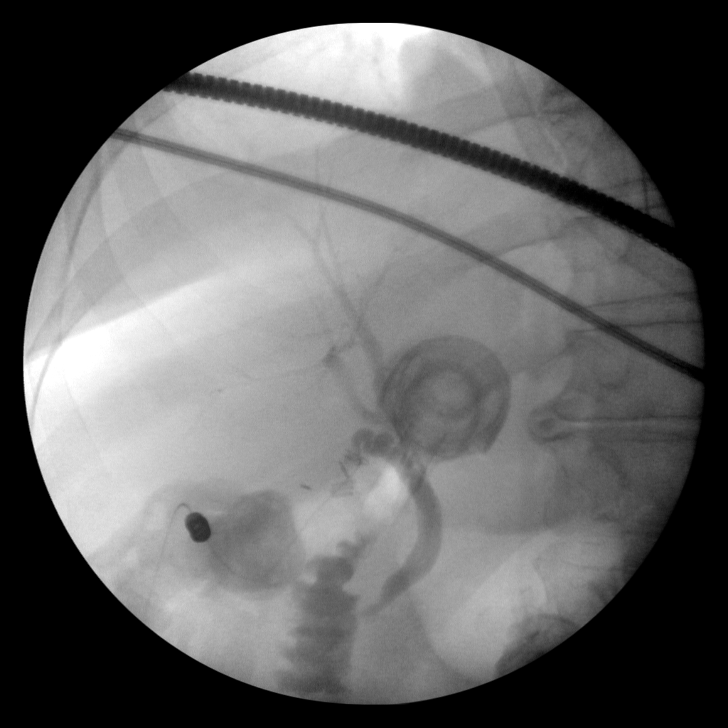
[im 2/2]
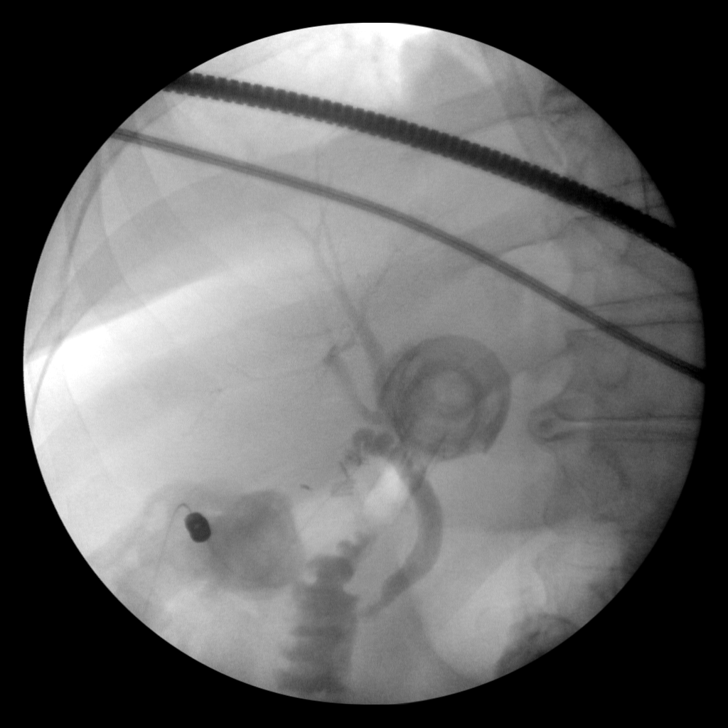

[8 of 8 positions shown; findings below may reference images not displayed]

FINDINGS: Contrast injection demonstrates opacification of the cystic duct,
common bile duct and duodenum. No large filling defects or stones.
There is minor filling of the intrahepatic biliary system.
IMPRESSION: Patent biliary system without obstructing stones or lesions.

## 2016-05-31 ENCOUNTER — Emergency Department (HOSPITAL_COMMUNITY)
Admission: EM | Admit: 2016-05-31 | Discharge: 2016-05-31 | Disposition: A | Discharge status: Home / Self Care | Payer: BLUE CROSS/BLUE SHIELD | Attending: Emergency Medicine | Admitting: Emergency Medicine

## 2016-05-31 ENCOUNTER — Emergency Department (HOSPITAL_COMMUNITY): Payer: BLUE CROSS/BLUE SHIELD

## 2016-05-31 ENCOUNTER — Encounter (HOSPITAL_COMMUNITY): Payer: Self-pay | Admitting: Emergency Medicine

## 2016-05-31 DIAGNOSIS — I10 Essential (primary) hypertension: Secondary | ICD-10-CM | POA: Insufficient documentation

## 2016-05-31 DIAGNOSIS — R002 Palpitations: Secondary | ICD-10-CM | POA: Insufficient documentation

## 2016-05-31 DIAGNOSIS — R600 Localized edema: Secondary | ICD-10-CM | POA: Diagnosis not present

## 2016-05-31 DIAGNOSIS — Z79899 Other long term (current) drug therapy: Secondary | ICD-10-CM | POA: Diagnosis not present

## 2016-05-31 LAB — URINALYSIS, ROUTINE W REFLEX MICROSCOPIC
BILIRUBIN URINE: NEGATIVE
Glucose, UA: NEGATIVE mg/dL
Hgb urine dipstick: NEGATIVE
KETONES UR: NEGATIVE mg/dL
LEUKOCYTES UA: NEGATIVE
Nitrite: NEGATIVE
PROTEIN: NEGATIVE mg/dL
Specific Gravity, Urine: 1.018 (ref 1.005–1.030)
pH: 5 (ref 5.0–8.0)

## 2016-05-31 LAB — CBC
HEMATOCRIT: 46.1 % — AB (ref 36.0–46.0)
Hemoglobin: 15.6 g/dL — ABNORMAL HIGH (ref 12.0–15.0)
MCH: 29.8 pg (ref 26.0–34.0)
MCHC: 33.8 g/dL (ref 30.0–36.0)
MCV: 88 fL (ref 78.0–100.0)
PLATELETS: 298 10*3/uL (ref 150–400)
RBC: 5.24 MIL/uL — AB (ref 3.87–5.11)
RDW: 13.4 % (ref 11.5–15.5)
WBC: 6.2 10*3/uL (ref 4.0–10.5)

## 2016-05-31 LAB — BASIC METABOLIC PANEL
Anion gap: 7 (ref 5–15)
BUN: 16 mg/dL (ref 6–20)
CHLORIDE: 104 mmol/L (ref 101–111)
CO2: 27 mmol/L (ref 22–32)
Calcium: 9.4 mg/dL (ref 8.9–10.3)
Creatinine, Ser: 0.85 mg/dL (ref 0.44–1.00)
GFR calc non Af Amer: >60 mL/min (ref >60)
Glucose, Bld: 100 mg/dL — ABNORMAL HIGH (ref 65–99)
POTASSIUM: 4 mmol/L (ref 3.5–5.1)
Sodium: 138 mmol/L (ref 135–145)

## 2016-05-31 LAB — I-STAT TROPONIN, ED
Troponin i, poc: 0 ng/mL (ref 0.00–0.08)
Troponin i, poc: 0 ng/mL (ref 0.00–0.08)

## 2016-05-31 LAB — BRAIN NATRIURETIC PEPTIDE: B NATRIURETIC PEPTIDE 5: 32.4 pg/mL (ref 0.0–100.0)

## 2016-05-31 LAB — MAGNESIUM: MAGNESIUM: 2.3 mg/dL (ref 1.7–2.4)

## 2016-05-31 MED ORDER — METOPROLOL TARTRATE 25 MG PO TABS
25.0000 mg | ORAL_TABLET | Freq: Once | ORAL | Status: AC
  Administered 2016-05-31: 25 mg via ORAL
  Filled 2016-05-31: qty 1

## 2016-05-31 MED ORDER — METOPROLOL TARTRATE 25 MG PO TABS: 50.0000 mg | ORAL_TABLET | Freq: Two times a day (BID) | ORAL | 0 refills | Status: DC

## 2016-05-31 MED ORDER — METOPROLOL TARTRATE 5 MG/5ML IV SOLN: 5.0000 mg | Freq: Once | INTRAVENOUS | Status: DC

## 2016-05-31 NOTE — Discharge Instructions (Signed)
Please start taking her metoprolol to help with your elevated blood pressure and palpitations. This is the same medicine you had taken in the past. Please schedule a follow-up appointment with a PCP for further management. If any symptoms change or worsen, please return to the nearest emergency department.

## 2016-05-31 NOTE — ED Provider Notes (Signed)
[facility]-EMERGENCY DEPT Provider Note   CSN: [id number] Arrival date & time: 05/31/16  1440     History   Chief Complaint Chief Complaint  Patient presents with  . Palpitations    HPI Tiffany Lane is a 61 y.o. female.  The history is provided by the patient, a friend and medical records.  Palpitations   This is a new problem. The current episode started yesterday. The problem occurs constantly. The problem has not changed since onset.Pertinent negatives include no diaphoresis, no fever, no malaise/fatigue, no numbness, no chest pain, no chest pressure, no exertional chest pressure, no irregular heartbeat, no near-syncope, no syncope, no abdominal pain, no nausea, no vomiting, no headaches, no back pain, no leg pain, no lower extremity edema, no dizziness, no weakness, no cough, no hemoptysis, no shortness of breath and no sputum production. She has tried nothing for the symptoms. The treatment provided no relief. Her past medical history does not include heart disease.    Past Medical History:  Diagnosis Date  . Migraine     Patient Active Problem List   Diagnosis Date Noted  . Acute gallstone pancreatitis 09/22/2013  . Gallstones 09/22/2013  . DERMATITIS, ALLERGIC 08/22/2007  . UNSPECIFIED VITAMIN D DEFICIENCY 07/16/2007  . ALLERGIC RHINITIS 06/26/2007  . ADJUSTMENT DISORDER WITH ANXIETY 06/24/2007  . VARICOSE VEINS LOWER EXTREMITIES W/OTH COMPS 06/24/2007  . NUMBNESS 06/24/2007  . EDEMA 06/24/2007  . PALPITATIONS, RECURRENT 06/24/2007    Past Surgical History:  Procedure Laterality Date  . CHOLECYSTECTOMY N/A 09/25/2013   Procedure: LAPAROSCOPIC CHOLECYSTECTOMY WITH INTRAOPERATIVE CHOLANGIOGRAM, REPAIR OF UMBILICAL HERNIA;  Surgeon: Kandis Cocking, MD;  Location: [facility];  Service: General;  Laterality: N/A;  . MYRINGOTOMY WITH TUBE PLACEMENT      OB History    No data available       Home Medications    Prior to Admission medications   Medication Sig Start  Date End Date Taking? Authorizing Provider  FAMOTIDINE PO Take 1 tablet by mouth daily as needed (reflux.).    Historical Provider, MD  ibuprofen (ADVIL,MOTRIN) 200 MG tablet Take 3 tablets (600 mg total) by mouth every 8 (eight) hours as needed (pain.). 09/28/13   Leroy Sea, MD  Probiotic Product (PROBIOTIC DAILY PO) Take by mouth.    Historical Provider, MD    Family History Family History  Problem Relation Age of Onset  . Other Mother   . Other Father     Social History Social History  Substance Use Topics  . Smoking status: Never Smoker  . Smokeless tobacco: Never Used  . Alcohol use No     Allergies   Adhesive [tape]; Blistex [phenol]; Iodine; Lip balm [lip medex]; and Prednisone   Review of Systems Review of Systems  Constitutional: Negative for appetite change, chills, diaphoresis, fatigue, fever and malaise/fatigue.  HENT: Negative for congestion and rhinorrhea.   Eyes: Negative for visual disturbance.  Respiratory: Negative for cough, hemoptysis, sputum production, chest tightness, shortness of breath, wheezing and stridor.   Cardiovascular: Positive for palpitations. Negative for chest pain, leg swelling, syncope and near-syncope.  Gastrointestinal: Negative for abdominal pain, constipation, diarrhea, nausea and vomiting.  Genitourinary: Negative for dysuria and flank pain.  Musculoskeletal: Negative for back pain, neck pain and neck stiffness.  Skin: Negative for rash and wound.  Neurological: Negative for dizziness, weakness, light-headedness, numbness and headaches.  Psychiatric/Behavioral: Negative for agitation and confusion.  All other systems reviewed and are negative.    Physical Exam Updated Vital  Signs BP (!) 106/105 (BP Location: Right Arm)   Pulse 84   Temp 98.4 F (36.9 C) (Oral)   Resp 18   SpO2 98%   Physical Exam  Constitutional: She is oriented to person, place, and time. She appears well-developed and well-nourished. No distress.    HENT:  Head: Normocephalic and atraumatic.  Right Ear: External ear normal.  Left Ear: External ear normal.  Nose: Nose normal.  Mouth/Throat: Oropharynx is clear and moist. No oropharyngeal exudate.  Eyes: Conjunctivae and EOM are normal. Pupils are equal, round, and reactive to light.  Neck: Normal range of motion. Neck supple.  Cardiovascular: Normal rate, regular rhythm, normal heart sounds and intact distal pulses.   Extrasystoles are present.  No murmur heard. Pulmonary/Chest: Effort normal and breath sounds normal. No stridor. No respiratory distress. She has no wheezes. She has no rales. She exhibits no tenderness.  Abdominal: Soft. She exhibits no distension. There is no tenderness. There is no rebound.  Musculoskeletal: She exhibits no edema or tenderness.  Neurological: She is alert and oriented to person, place, and time. She has normal reflexes. No cranial nerve deficit or sensory deficit. She exhibits normal muscle tone. Coordination normal.  Skin: Skin is warm. Capillary refill takes less than 2 seconds. No rash noted. She is not diaphoretic. No erythema.  Psychiatric: She has a normal mood and affect.  Nursing note and vitals reviewed.    ED Treatments / Results  Labs (all labs ordered are listed, but only abnormal results are displayed) Labs Reviewed  BASIC METABOLIC PANEL - Abnormal; Notable for the following:       Result Value   Glucose, Bld 100 (*)    All other components within normal limits  CBC - Abnormal; Notable for the following:    RBC 5.24 (*)    Hemoglobin 15.6 (*)    HCT 46.1 (*)    All other components within normal limits  BRAIN NATRIURETIC PEPTIDE  MAGNESIUM  URINALYSIS, ROUTINE W REFLEX MICROSCOPIC  I-STAT TROPOININ, ED  I-STAT TROPOININ, ED    EKG  EKG Interpretation  Date/Time:  Thursday May 31 2016 14:54:55 EDT Ventricular Rate:  90 PR Interval:  130 QRS Duration: 90 QT Interval:  382 QTC Calculation: 467 R Axis:   -78 Text  Interpretation:  Sinus rhythm with occasional Premature ventricular complexes Left anterior fascicular block Abnormal ECG when compared to prior, more PVC present.  No STEMI Confirmed by Rush Landmark MD, CHRISTOPHER [phone]) on 05/31/2016 6:09:05 PM       Radiology Dg Chest 2 View  Result Date: 05/31/2016 CLINICAL DATA:  Palpitations and high blood pressure. EXAM: CHEST  2 VIEW COMPARISON:  10/02/2013 FINDINGS: The heart size and mediastinal contours are within normal limits. Aortic atherosclerosis noted. Both lungs are clear. The visualized skeletal structures are unremarkable. IMPRESSION: No active cardiopulmonary disease. Aortic Atherosclerosis (ICD10-I70.0). Electronically Signed   By: Signa Kell M.D.   On: 05/31/2016 15:22    Procedures Procedures (including critical care time)  Medications Ordered in ED Medications  metoprolol tartrate (LOPRESSOR) tablet 25 mg (25 mg Oral Given 05/31/16 2030)     Initial Impression / Assessment and Plan / ED Course  I have reviewed the triage vital signs and the nursing notes.  Pertinent labs & imaging results that were available during my care of the patient were reviewed by me and considered in my medical decision making (see chart for details).     Tiffany Lane is a 61 y.o. female  With a past medical history significant for migraines and prior palpitations who presents with palpitations. Patient reports that over the last few days, patient has had return of palpitations. She says that worsened today prompting her to seek evaluation. Patient says that she was found to be hypertensive with a blood pressure of 220/170 at an urgent care and was transferred to the ED for evaluation. Patient reports that in the past, she had a cardiac workup for palpitations including an event monitor. She reports that she was on metoprolol for a period of time before discontinuation. Patient says that she has not been sleeping well recently and has had poor diet.   She  denies any chest pain, shortness of breath, lightheadedness, nausea, vomiting, diaphoresis. She denies any recent trauma. She denies fevers, chills, cough, congestion, rhinorrhea. She denies any other undervalues.  On initial valuation, patient's blood pressure is in the 170 someone 80s. Patient is having numerous PVCs which she is feeling.  Patient had workup including bloodwork, EKG, and chest x-ray. Diagnostic testing was reassuring in regards to electrolytes, looking for occult infections, and abnormalities on imaging.  Patient remained hypertensive. Given patients prior success with metoprolol and her elevated blood pressures, one dose of metoprolol was given. Patient had improvement in blood pressure and resolution in her palpitation sensation.  Patient will be started back on her metoprolol and given several days of medications. Patient will follow up with her PCP and cardiologist for ongoing management of her palpitations and hypertension. Patient was given strict return precautions for any new or worsened symptoms. Patient vocalize understanding of the plan of care and had no other questions or concerns. Patient discharged in good condition.   Final Clinical Impressions(s) / ED Diagnoses   Final diagnoses:  Palpitations  Hypertension, unspecified type    New Prescriptions Discharge Medication List as of 05/31/2016 10:41 PM    START taking these medications   Details  metoprolol (LOPRESSOR) 25 MG tablet Take 2 tablets (50 mg total) by mouth 2 (two) times daily., Starting Thu 05/31/2016, Print         Clinical Impression: 1. Palpitations   2. Hypertension, unspecified type     Disposition: Discharge  Condition: Good  I have discussed the results, Dx and Tx plan with the pt(& family if present). He/she/they expressed understanding and agree(s) with the plan. Discharge instructions discussed at great length. Strict return precautions discussed and pt &/or family have  verbalized understanding of the instructions. No further questions at time of discharge.    Discharge Medication List as of 05/31/2016 10:41 PM    START taking these medications   Details  metoprolol (LOPRESSOR) 25 MG tablet Take 2 tablets (50 mg total) by mouth 2 (two) times daily., Starting Thu 05/31/2016, Print        Follow Up: Thayer Headings, MD [address] [place] [address] [phone]  Schedule an appointment as soon as possible for a visit    [facility] EMERGENCY DEPARTMENT [address] [id number] mc [place] [address] [phone]  If symptoms worsen     Heide Scales, MD 06/01/16 1427

## 2016-05-31 NOTE — ED Triage Notes (Signed)
Per EMS: pt sts palpitations and is from PCP; pt sts some CP today; pt noted to have PVC per EMS

## 2016-06-22 ENCOUNTER — Encounter: Payer: Self-pay | Admitting: Physician Assistant

## 2016-06-22 ENCOUNTER — Ambulatory Visit (INDEPENDENT_AMBULATORY_CARE_PROVIDER_SITE_OTHER): Payer: BLUE CROSS/BLUE SHIELD | Admitting: Physician Assistant

## 2016-06-22 VITALS — BP 152/83 | HR 86 | Temp 97.7°F | Resp 18 | Ht 65.5 in | Wt 228.4 lb

## 2016-06-22 DIAGNOSIS — Z7689 Persons encountering health services in other specified circumstances: Secondary | ICD-10-CM | POA: Diagnosis not present

## 2016-06-22 DIAGNOSIS — Z6837 Body mass index (BMI) 37.0-37.9, adult: Secondary | ICD-10-CM | POA: Insufficient documentation

## 2016-06-22 DIAGNOSIS — Z0000 Encounter for general adult medical examination without abnormal findings: Secondary | ICD-10-CM | POA: Diagnosis not present

## 2016-06-22 NOTE — Patient Instructions (Addendum)
     IF you received an x-ray today, you will receive an invoice from [facility]. Please contact [facility] at [phone] with questions or concerns regarding your invoice.   IF you received labwork today, you will receive an invoice from [facility]. Please contact [facility] at [phone] with questions or concerns regarding your invoice.   Our billing staff will not be able to assist you with questions regarding bills from these companies.  You will be contacted with the lab results as soon as they are available. The fastest way to get your results is to activate your My Chart account. Instructions are located on the last page of this paperwork. If you have not heard from us regarding the results in 2 weeks, please contact this office.    We recommend that you schedule a mammogram for breast cancer screening. Typically, you do not need a referral to do this. Please contact a local imaging center to schedule your mammogram.  [facility] - [phone]  *ask for the Radiology Department [facility]) - [phone] or [phone]  [facility] - [phone] [facility] - [phone] [facility] - [phone]  *ask for the Radiology Department [facility] - [phone]  *ask for the Radiology Department [facility] - [phone]  *ask for the Mammography Department [facility] - [phone] 

## 2016-06-22 NOTE — Progress Notes (Signed)
Patient ID: Tiffany Lane, female     DOB: [birth year], 61 y.o.    MRN: [id number]  PCP: No primary care provider on file.  Chief Complaint  Patient presents with  . Establish Care    discussed with patient mammogram,pap and possible blood work  . finger issue    patient would like to have her finger looked at on right hand; possible dressing change    Subjective:   This patient is new to me and presents to establish for primary care and for Annual Wellness Exam.  She was previously followed by Dr. Fabian Sharp, but hasn't been in quite some time.  Cervical Cancer Screening: has been years. Never had sex with men, but previous female partner(s) have. Very low risk. Breast Cancer Screening: performs monthly SBE, infrequent CBE. Has never had a mammogram. Colorectal Cancer Screening: not yet. Bone Density Testing: not yet. HIV Screening: probably in the past. Very low risk.. STI Screening: Very low risk. Seasonal Influenza Vaccination: declined Td/Tdap Vaccination: current 06/16/2016  Pneumococcal Vaccination: not yet a candidate Zoster Vaccination: not yet  On 5/05, while mowing her lawn, she reached under the mower to remove a clog and injured the distal middle finger. She drove herself to the ED, where she was diagnosed with a fracture of the distal phalanx, and the wound was repaired. She was prescribed Augmentin and referred to [facility]. Her first visit there was Monday 5/07. That note is reviewed. She is washing twice daily and performing dressing changes (Xeroform, Telfa and gauze). The finger splint is more problematic than helpful, so she isn't wearing it. She is leaving the wound open to the air for about 30 minutes/night, usually just before bed. She will follow-up with [facility] next week.  On 05/31/16 she presented to the ED with palpitations and near syncope. SBP was 220's. EKG, troponins x 2, magnesium, CBC and BMET were normal. She received a dose of metoprolol with  normalization of BP and resolution of the palpitations. In the weeks prior to the symptoms, she had been under considerable stress at home, consuming more caffeine and not getting adequate stress. Since discharge, she has reduced the metoprolol dose to 12.5 mg at night, when she remembers. "It was probably a panic attack." She isn't interested in taking medications that she doesn't have to, and would rather not continue the metoprolol on a daily basis. She does find comfort in knowing that she has it, just in case. A large portion of her anxiety is that as a single parent, she doesn't know who would take her sons if something happened to her.  She has had episodic palpitations in the past, initially about 10 years ago, also during a time of great stress. At that time, she had been diagnosed with influenza and CAP. She took a dose of nasal Relenza and developed complete loss of sensation down the RIGHT side of her body, palpitations and dizziness. She had a cardiac work up that included Holter monitoring and did not identify a cardiac cause of her symptoms. She saw neurology, Dr. Sandria Manly, who suspected atypical migraine. MRI/MRA were normal and she was prescribed metoprolol. She believed that the symptoms were triggered by stress, both physical and emotional, and so stopped treatment.  Stressors include her son Ebony Cargo, who frequently doesn't want to go to school, using any small excuse to avoid going. Her younger son, Earlene Plater, is an Conservator, museum/gallery and will be attending [employer] next year for [employer], and  was recently rehearsing for two productions in which he had major roles. Her older brother has recently completed treatment for a meningioma, found on evaluation of sudden expressive aphasia. He was the same age as their mother when she was diagnosed with a meningioma, though her treatment was complicated due to the location of the meningioma (brainstem) and she did not survive.  Knowing that the treatment is  much easier when performed early, and knowing that waiting until symptoms appear can result in large meningiomas, she wonders if she would be a candidate for screening MRI.  Due to her two recent ED visits, her health spending account is not as full as she had planned. She isn't sure that she can afford mammography (she has never had one) or colonoscopy (also never had one). She may be interested in Cologuard in the future. She declines screening for HIV and Hep C, believing that she is not at risk for these infections. She also declines cervical cancer screening, understanding that while her risk is very low, it is not zero.  Home BP monitoring 120's/60's.   Review of Systems  Constitutional: Negative.   HENT: Negative.   Eyes: Negative.   Respiratory: Negative.   Cardiovascular: Negative.   Gastrointestinal: Negative.   Endocrine: Negative.   Genitourinary: Negative.   Musculoskeletal: Positive for arthralgias (RIGHT middle finger, due to recent trauma). Negative for back pain, gait problem, joint swelling, myalgias, neck pain and neck stiffness.  Skin: Positive for wound. Negative for color change, pallor and rash.  Allergic/Immunologic: Negative.   Neurological: Negative.   Psychiatric/Behavioral: Negative.      Prior to Admission medications   Medication Sig Start Date End Date Taking? Authorizing Provider  amoxicillin-clavulanate (AUGMENTIN) 875-125 MG tablet Take 1 tablet by mouth 2 (two) times daily.   Yes [provider]  metoprolol succinate (TOPROL-XL) 25 MG 24 hr tablet Take 12.5 mg by mouth at bedtime. 05/31/16  Yes [provider]     Allergies  Allergen Reactions  . Adhesive [Tape] Hives  . Blistex [Phenol] Other (See Comments)    Blisters lips  . Iodine Hives  . Lip Balm [Lip Medex] Hives    Allergic to Carmex  . Prednisone Hives    Fully-body hives     Patient Active Problem List   Diagnosis Date Noted  . Dermatitis 08/22/2007  .  UNSPECIFIED VITAMIN D DEFICIENCY 07/16/2007  . ALLERGIC RHINITIS 06/26/2007  . ADJUSTMENT DISORDER WITH ANXIETY 06/24/2007  . VARICOSE VEINS LOWER EXTREMITIES W/OTH COMPS 06/24/2007  . NUMBNESS 06/24/2007  . EDEMA 06/24/2007  . PALPITATIONS, RECURRENT 06/24/2007     Family History  Problem Relation Age of Onset  . Other Mother [id number]       cerebral meningioma, brain stem  . Other Brother [id number]       bening cerebral meningioma     Social History   Social History  . Marital status: Single    Spouse name: N/A  . Number of children: 2  . Years of education: N/A   Occupational History  . Physical Therapist [employer]   Social History Main Topics  . Smoking status: Never Smoker  . Smokeless tobacco: Never Used  . Alcohol use No  . Drug use: No  . Sexual activity: No   Other Topics Concern  . Not on file   Social History Narrative   Lives with 2 adopted sons, Ebony CargoClayton (who has ADHD and borderline diabetes) and Earlene PlaterDavis (an Conservator, museum/galleryaspiring actor).   Helped raise her  ex-partner's daughter, Sharlynn Oliphant from age 86 years, now an actress in [place].         Objective:  Physical Exam  Constitutional: She is oriented to person, place, and time. Vital signs are normal. She appears well-developed and well-nourished. She is active and cooperative. No distress.  BP (!) 152/83   Pulse 86   Temp 97.7 F (36.5 C) (Oral)   Resp 18   Ht 5' 5.5" (1.664 m)   Wt 228 lb 6.4 oz (103.6 kg)   SpO2 94%   BMI 37.43 kg/m    HENT:  Head: Normocephalic and atraumatic.  Right Ear: Hearing, tympanic membrane, external ear and ear canal normal. No foreign bodies.  Left Ear: Hearing, tympanic membrane, external ear and ear canal normal. No foreign bodies.  Nose: Nose normal.  Mouth/Throat: Uvula is midline, oropharynx is clear and moist and mucous membranes are normal. No oral lesions. Normal dentition. No dental abscesses or uvula swelling. No oropharyngeal exudate.  Eyes: Conjunctivae, EOM and lids are normal. Pupils  are equal, round, and reactive to light. Right eye exhibits no discharge. Left eye exhibits no discharge. No scleral icterus.  Fundoscopic exam:      The right eye shows no arteriolar narrowing, no AV nicking, no exudate, no hemorrhage and no papilledema. The right eye shows red reflex.       The left eye shows no arteriolar narrowing, no AV nicking, no exudate, no hemorrhage and no papilledema. The left eye shows red reflex.  Neck: Trachea normal, normal range of motion and full passive range of motion without pain. Neck supple. No spinous process tenderness and no muscular tenderness present. No thyroid mass and no thyromegaly present.  Cardiovascular: Normal rate, regular rhythm, normal heart sounds, intact distal pulses and normal pulses.   Prominent varicose veins bilateral LE. Increased rubor, 1+ pitting edema RIGHT LE are chronic.  Pulmonary/Chest: Effort normal and breath sounds normal.  Musculoskeletal: She exhibits no edema or tenderness.       Cervical back: Normal.       Thoracic back: Normal.       Lumbar back: Normal.  Lymphadenopathy:       Head (right side): No tonsillar, no preauricular, no posterior auricular and no occipital adenopathy present.       Head (left side): No tonsillar, no preauricular, no posterior auricular and no occipital adenopathy present.    She has no cervical adenopathy.       Right: No supraclavicular adenopathy present.       Left: No supraclavicular adenopathy present.  Neurological: She is alert and oriented to person, place, and time. She has normal strength and normal reflexes. No cranial nerve deficit. She exhibits normal muscle tone. Coordination and gait normal.  Skin: Skin is warm and dry. Laceration (RIGHT middle finger, distal; no evidence of cellulitis. Appears to be healing as expected. REdressed.) noted. No rash noted. She is not diaphoretic. No cyanosis or erythema. Nails show no clubbing.  Psychiatric: She has a normal mood and affect. Her  speech is normal and behavior is normal. Judgment and thought content normal.       Assessment & Plan:  1. Annual physical exam Age appropriate anticipatory guidance provided. Encouraged her to consider mammography and Cologuard. She will contact her insurance plan to determine what coverage she has for these procedures.  2. Encounter to establish care   No Follow-up on file.   Fernande Bras, PA-C [facility] at [facility]

## 2016-07-18 ENCOUNTER — Telehealth: Payer: Self-pay | Admitting: Physician Assistant

## 2016-07-18 MED ORDER — METOPROLOL SUCCINATE ER 25 MG PO TB24: 12.5000 mg | ORAL_TABLET | Freq: Every day | ORAL | 3 refills | Status: DC

## 2016-07-18 NOTE — Telephone Encounter (Signed)
Pt is requesting a Rx called in for metoprolol, states that you all went over it and you would call the order in for her.  [phone]

## 2017-06-28 ENCOUNTER — Encounter: Payer: Self-pay | Admitting: Physician Assistant

## 2017-06-28 ENCOUNTER — Other Ambulatory Visit: Payer: Self-pay

## 2017-06-28 ENCOUNTER — Ambulatory Visit (INDEPENDENT_AMBULATORY_CARE_PROVIDER_SITE_OTHER): Payer: BLUE CROSS/BLUE SHIELD | Admitting: Physician Assistant

## 2017-06-28 VITALS — BP 138/98 | HR 80 | Temp 97.5°F | Resp 16 | Ht 65.5 in | Wt 226.2 lb

## 2017-06-28 DIAGNOSIS — G43109 Migraine with aura, not intractable, without status migrainosus: Secondary | ICD-10-CM

## 2017-06-28 DIAGNOSIS — Z1389 Encounter for screening for other disorder: Secondary | ICD-10-CM | POA: Diagnosis not present

## 2017-06-28 DIAGNOSIS — I83893 Varicose veins of bilateral lower extremities with other complications: Secondary | ICD-10-CM

## 2017-06-28 DIAGNOSIS — Z13220 Encounter for screening for lipoid disorders: Secondary | ICD-10-CM

## 2017-06-28 DIAGNOSIS — Z6837 Body mass index (BMI) 37.0-37.9, adult: Secondary | ICD-10-CM

## 2017-06-28 DIAGNOSIS — Z130 Encounter for screening for diseases of the blood and blood-forming organs and certain disorders involving the immune mechanism: Secondary | ICD-10-CM

## 2017-06-28 DIAGNOSIS — R030 Elevated blood-pressure reading, without diagnosis of hypertension: Secondary | ICD-10-CM

## 2017-06-28 DIAGNOSIS — Z1159 Encounter for screening for other viral diseases: Secondary | ICD-10-CM | POA: Diagnosis not present

## 2017-06-28 DIAGNOSIS — Z114 Encounter for screening for human immunodeficiency virus [HIV]: Secondary | ICD-10-CM

## 2017-06-28 DIAGNOSIS — Z113 Encounter for screening for infections with a predominantly sexual mode of transmission: Secondary | ICD-10-CM

## 2017-06-28 DIAGNOSIS — J301 Allergic rhinitis due to pollen: Secondary | ICD-10-CM

## 2017-06-28 DIAGNOSIS — Z1211 Encounter for screening for malignant neoplasm of colon: Secondary | ICD-10-CM

## 2017-06-28 DIAGNOSIS — Z0000 Encounter for general adult medical examination without abnormal findings: Secondary | ICD-10-CM | POA: Diagnosis not present

## 2017-06-28 DIAGNOSIS — Z1329 Encounter for screening for other suspected endocrine disorder: Secondary | ICD-10-CM

## 2017-06-28 DIAGNOSIS — Z13228 Encounter for screening for other metabolic disorders: Secondary | ICD-10-CM

## 2017-06-28 DIAGNOSIS — Z0184 Encounter for antibody response examination: Secondary | ICD-10-CM

## 2017-06-28 DIAGNOSIS — Z124 Encounter for screening for malignant neoplasm of cervix: Secondary | ICD-10-CM | POA: Diagnosis not present

## 2017-06-28 DIAGNOSIS — Z1231 Encounter for screening mammogram for malignant neoplasm of breast: Secondary | ICD-10-CM | POA: Diagnosis not present

## 2017-06-28 DIAGNOSIS — R002 Palpitations: Secondary | ICD-10-CM

## 2017-06-28 MED ORDER — AZELASTINE HCL 0.1 % NA SOLN: 2.0000 | Freq: Two times a day (BID) | NASAL | 0 refills | Status: AC

## 2017-06-28 MED ORDER — METOPROLOL SUCCINATE ER 25 MG PO TB24: 12.5000 mg | ORAL_TABLET | Freq: Every day | ORAL | 3 refills | Status: AC

## 2017-06-28 NOTE — Patient Instructions (Addendum)
[facility] [address], [place], [place] [address] [phone]   We recommend that you schedule a mammogram for breast cancer screening. Typically, you do not need a referral to do this. Please contact a local imaging center to schedule your mammogram.  [facility] - [phone]  *ask for the Radiology Department [facility]) - [phone] or [phone]  [facility] - [phone] [place] [phone] [facility] - [phone]  *ask for the [facility] - [phone]  *ask for the Radiology Department [facility] - [phone]  *ask for the [facility] - [phone]  IF you received an x-ray today, you will receive an invoice from [facility]. Please contact [facility] at [phone] with questions or concerns regarding your invoice.   IF you received labwork today, you will receive an invoice from [facility]. Please contact [facility] at [phone] with questions or concerns regarding your invoice.   Our billing staff will not be able to assist you with questions regarding bills from these companies.  You will be contacted with the lab results as soon as they are available. The fastest way to get your results is to activate your My Chart account. Instructions are located on the last page of this paperwork. If you have not heard from [place] regarding the results in 2 weeks, please contact this office.      Preventive Care 40-64 Years, Female Preventive care refers to lifestyle choices and visits with your health care provider that can promote health and wellness. What does preventive care include?  A yearly physical exam. This is also called an annual well check.  Dental exams once or twice a year.  Routine eye exams. Ask your health care provider how often you should have your eyes  checked.  Personal lifestyle choices, including: ? Daily care of your teeth and gums. ? Regular physical activity. ? Eating a healthy diet. ? Avoiding tobacco and drug use. ? Limiting alcohol use. ? Practicing safe sex. ? Taking low-dose aspirin daily starting at age 29. ? Taking vitamin and mineral supplements as recommended by your health care provider. What happens during an annual well check? The services and screenings done by your health care provider during your annual well check will depend on your age, overall health, lifestyle risk factors, and family history of disease. Counseling Your health care provider may ask you questions about your:  Alcohol use.  Tobacco use.  Drug use.  Emotional well-being.  Home and relationship well-being.  Sexual activity.  Eating habits.  Work and work Statistician.  Method of birth control.  Menstrual cycle.  Pregnancy history.  Screening You may have the following tests or measurements:  Height, weight, and BMI.  Blood pressure.  Lipid and cholesterol levels. These may be checked every 5 years, or more frequently if you are over 26 years old.  Skin check.  Lung cancer screening. You may have this screening every year starting at age 47 if you have a 30-pack-year history of smoking and currently smoke or have quit within the past 15 years.  Fecal occult blood test (FOBT) of the stool. You may have this test every year starting at age 15.  Flexible sigmoidoscopy or colonoscopy. You may have a sigmoidoscopy every 5 years or a colonoscopy every 10 years starting at age 65.  Hepatitis C blood test.  Hepatitis B blood test.  Sexually transmitted disease (STD) testing.  Diabetes screening. This is done by checking your blood sugar (glucose) after you have not eaten for a while (fasting). You may have this done every 1-3 years.  Mammogram. This may be done every 1-2 years. Talk to your health care provider about when  you should start having regular mammograms. This may depend on whether you have a family history of breast cancer.  BRCA-related cancer screening. This may be done if you have a family history of breast, ovarian, tubal, or peritoneal cancers.  Pelvic exam and Pap test. This may be done every 3 years starting at age 29. Starting at age 60, this may be done every 5 years if you have a Pap test in combination with an HPV test.  Bone density scan. This is done to screen for osteoporosis. You may have this scan if you are at high risk for osteoporosis.  Discuss your test results, treatment options, and if necessary, the need for more tests with your health care provider. Vaccines Your health care provider may recommend certain vaccines, such as:  Influenza vaccine. This is recommended every year.  Tetanus, diphtheria, and acellular pertussis (Tdap, Td) vaccine. You may need a Td booster every 10 years.  Varicella vaccine. You may need this if you have not been vaccinated.  Zoster vaccine. You may need this after age 60.  Measles, mumps, and rubella (MMR) vaccine. You may need at least one dose of MMR if you were born in 1957 or later. You may also need a second dose.  Pneumococcal 13-valent conjugate (PCV13) vaccine. You may need this if you have certain conditions and were not previously vaccinated.  Pneumococcal polysaccharide (PPSV23) vaccine. You may need one or two doses if you smoke cigarettes or if you have certain conditions.  Meningococcal vaccine. You may need this if you have certain conditions.  Hepatitis A vaccine. You may need this if you have certain conditions or if you travel or work in places where you may be exposed to hepatitis A.  Hepatitis B vaccine. You may need this if you have certain conditions or if you travel or work in places where you may be exposed to hepatitis B.  Haemophilus influenzae type b (Hib) vaccine. You may need this if you have certain  conditions.  Talk to your health care provider about which screenings and vaccines you need and how often you need them. This information is not intended to replace advice given to you by your health care provider. Make sure you discuss any questions you have with your health care provider. Document Released: 02/25/2015 Document Revised: 10/19/2015 Document Reviewed: 11/30/2014 Elsevier Interactive Patient Education  [employer].

## 2017-06-28 NOTE — Progress Notes (Signed)
Patient ID: NIEVE ROJERO, female    DOB: [birth year], 62 y.o.   MRN: [id number]  PCP: Porfirio Oar, PA-C  Chief Complaint  Patient presents with  . Annual Exam    Subjective:   Presents for [employer].  Cervical Cancer Screening: has been years. Never had sex with men, but previous female partner(s) have. Very low risk. Breast Cancer Screening: performs monthly SBE, infrequent CBE. Has never had a mammogram. Colorectal Cancer Screening: not yet, not willing to have colonoscopy, but willing to do Cologuard Bone Density Testing: not yet a candidate HIV Screening: not previously documented. Today. STI Screening: today. Seasonal Influenza Vaccination: declines, despite work in healthcare Td/Tdap Vaccination: 06/16/2016 Pneumococcal Vaccination: not yet a candidate Zoster Vaccination: not yet Frequency of Dental evaluation: not frequently, but plans to schedule Frequency of Eye evaluation: annually, wears contact lenses  She is in a new relationship and her partner is motivating her to close care gaps she has previously declined.  Work as a Adult nurse continues to go well.  Her older son, Ebony Cargo, graduated a semester early from the early college at [employer].  He has moved out of the house and is living with friends.  Following 3 car crashes he no longer has a vehicle and is dependent on the city bus for transportation to and from work.  This past semester he took 3 hours at [employer] and plans to continue pursuing his education.  Her younger son, Earlene Plater, just finished his first year at [employer] in musical theater.  He earned all A's and has recently obtained a learners permit.  She has developed some soreness in the right shoulder and neck as she works to renovate her home.  The intention is to put it on the market and by something else that will accommodate her new partner, Clemens Catholic, who currently lives in [place].  She intends to put more energy and to  healthy lifestyle changes: Increase walking, work with a Systems analyst and make healthy eating choices.  Her sweet tooth is a recognized obstacle.  Review of Systems  Constitutional: Negative.   HENT: Positive for ear pain, postnasal drip and sinus pressure. Negative for congestion, dental problem, drooling, ear discharge, facial swelling, hearing loss, mouth sores, nosebleeds, rhinorrhea, sinus pain, sneezing, sore throat, tinnitus, trouble swallowing and voice change.   Eyes: Negative.   Respiratory: Negative.   Cardiovascular: Positive for leg swelling (chronic, R>L, severe varicose veins, not interested in treatment). Negative for chest pain and palpitations.  Gastrointestinal: Negative.   Endocrine: Positive for polyuria. Negative for cold intolerance, heat intolerance, polydipsia and polyphagia.  Genitourinary: Positive for frequency (at night, Q3-4 hours) and urgency (no incontinence). Negative for decreased urine volume, difficulty urinating, dyspareunia, dysuria, enuresis, flank pain, genital sores, hematuria, menstrual problem, pelvic pain, vaginal bleeding, vaginal discharge and vaginal pain.  Musculoskeletal: Positive for arthralgias (RIGHT shoulder), myalgias (RIGHT leg cramps) and neck pain. Negative for back pain, gait problem, joint swelling and neck stiffness.  Skin: Negative.        Large skin tag LEFT upper-inner thigh, sometimes gets caught on her clothing, rubs  Allergic/Immunologic: Positive for environmental allergies. Negative for food allergies and immunocompromised state.  Neurological: Positive for headaches (migraine, new headache behind both ears). Negative for dizziness, tremors, seizures, syncope, facial asymmetry, speech difficulty, weakness, light-headedness and numbness.  Hematological: Negative.   Psychiatric/Behavioral: Negative.     Patient Active Problem List   Diagnosis Date Noted  . Migraine  with aura and without status migrainosus, not intractable  06/28/2017  . BMI 37.0-37.9, adult 06/22/2016  . Dermatitis 08/22/2007  . UNSPECIFIED VITAMIN D DEFICIENCY 07/16/2007  . ALLERGIC RHINITIS 06/26/2007  . ADJUSTMENT DISORDER WITH ANXIETY 06/24/2007  . VARICOSE VEINS LOWER EXTREMITIES W/OTH COMPS 06/24/2007  . NUMBNESS 06/24/2007  . Edema 06/24/2007  . PALPITATIONS, RECURRENT 06/24/2007      Past Medical History:  Diagnosis Date  . Acute gallstone pancreatitis 09/22/2013  . Allergy   . Anxiety   . Gallstones 09/22/2013  . Migraine      Prior to Admission medications   Medication Sig Start Date End Date Taking? Authorizing Provider  metoprolol succinate (TOPROL-XL) 25 MG 24 hr tablet Take 0.5 tablets (12.5 mg total) by mouth at bedtime. 07/18/16  Yes Landy Mace, PA-C  amoxicillin-clavulanate (AUGMENTIN) 875-125 MG tablet Take 1 tablet by mouth 2 (two) times daily.    [provider]    Allergies  Allergen Reactions  . Adhesive [Tape] Hives  . Blistex [Phenol] Other (See Comments)    Blisters lips  . Iodine Hives  . Lip Balm [Lip Medex] Hives    Allergic to Carmex  . Prednisone Hives    Fully-body hives    Past Surgical History:  Procedure Laterality Date  . CHOLECYSTECTOMY N/A 09/25/2013   Procedure: LAPAROSCOPIC CHOLECYSTECTOMY WITH INTRAOPERATIVE CHOLANGIOGRAM, REPAIR OF UMBILICAL HERNIA;  Surgeon: Kandis Cocking, MD;  Location: [facility];  Service: General;  Laterality: N/A;  . MYRINGOTOMY WITH TUBE PLACEMENT      Family History  Problem Relation Age of Onset  . Other Mother 68       cerebral meningioma, brain stem  . Stroke Mother 37       associated with surgery for meningioma of brainstem  . Other Brother 7       benign cerebral meningioma    Social History   Socioeconomic History  . Marital status: Single    Spouse name: Not on file  . Number of children: 2  . Years of education: Not on file  . Highest education level: Not on file  Occupational History  . Occupation: Physical IT consultant: [employer]  Social Needs  . Financial resource strain: Not on file  . Food insecurity:    Worry: Not on file    Inability: Not on file  . Transportation needs:    Medical: Not on file    Non-medical: Not on file  Tobacco Use  . Smoking status: Never Smoker  . Smokeless tobacco: Never Used  Substance and Sexual Activity  . Alcohol use: No  . Drug use: No  . Sexual activity: Never  Lifestyle  . Physical activity:    Days per week: Not on file    Minutes per session: Not on file  . Stress: Not on file  Relationships  . Social connections:    Talks on phone: Not on file    Gets together: Not on file    Attends religious service: Not on file    Active member of club or organization: Not on file    Attends meetings of clubs or organizations: Not on file    Relationship status: Not on file  Other Topics Concern  . Not on file  Social History Narrative   Lives with the younger of her 2 adopted sons. Ebony Cargo (who has ADHD and borderline diabetes) graduated from the early college at [employer] a semester early.  He lives independently, rides the city  bus for transportation and works at [employer].  Earlene Plater (an Conservator, museum/gallery) attends [employer], where he is excelling in musical theater and fashion.   Current partner lives in [place], [place].  They plan to find at home together once she sells her house.   Helped raise her ex-partner's daughter, Sharlynn Oliphant from age 16 years, now an actress in [place].        Objective:  Physical Exam  Constitutional: She is oriented to person, place, and time. Vital signs are normal. She appears well-developed and well-nourished. She is active and cooperative. No distress.  BP (!) 138/98   Pulse 80   Temp (!) 97.5 F (36.4 C)   Resp 16   Ht 5' 5.5" (1.664 m)   Wt 226 lb 3.2 oz (102.6 kg)   SpO2 98%   BMI 37.07 kg/m    HENT:  Head: Normocephalic and atraumatic.  Right Ear: Hearing, tympanic membrane, external ear and ear canal normal. No foreign  bodies.  Left Ear: Hearing, tympanic membrane, external ear and ear canal normal. No foreign bodies.  Nose: Nose normal.  Mouth/Throat: Uvula is midline, oropharynx is clear and moist and mucous membranes are normal. No oral lesions. Normal dentition. No dental abscesses or uvula swelling. No oropharyngeal exudate.  Eyes: Pupils are equal, round, and reactive to light. Conjunctivae, EOM and lids are normal. Right eye exhibits no discharge. Left eye exhibits no discharge. No scleral icterus.  Fundoscopic exam:      The right eye shows no arteriolar narrowing, no AV nicking, no exudate, no hemorrhage and no papilledema. The right eye shows red reflex.       The left eye shows no arteriolar narrowing, no AV nicking, no exudate, no hemorrhage and no papilledema. The left eye shows red reflex.  Neck: Trachea normal, normal range of motion and full passive range of motion without pain. Neck supple. No spinous process tenderness and no muscular tenderness present. No thyroid mass and no thyromegaly present.  Cardiovascular: Normal rate, regular rhythm, normal heart sounds, intact distal pulses and normal pulses.  Severe varicose veins, both legs extending from the ankles to the thighs.  Pulmonary/Chest: Effort normal and breath sounds normal. Right breast exhibits no inverted nipple, no mass, no nipple discharge, no skin change and no tenderness. Left breast exhibits no inverted nipple, no mass, no nipple discharge, no skin change and no tenderness. Breasts are symmetrical.  Musculoskeletal: She exhibits no edema or tenderness.       Cervical back: Normal.       Thoracic back: Normal.       Lumbar back: Normal.  Lymphadenopathy:       Head (right side): No tonsillar, no preauricular, no posterior auricular and no occipital adenopathy present.       Head (left side): No tonsillar, no preauricular, no posterior auricular and no occipital adenopathy present.    She has no cervical adenopathy.       Right:  No supraclavicular adenopathy present.       Left: No supraclavicular adenopathy present.  Neurological: She is alert and oriented to person, place, and time. She has normal strength and normal reflexes. No cranial nerve deficit. She exhibits normal muscle tone. Coordination and gait normal.  Skin: Skin is warm, dry and intact. No rash noted. She is not diaphoretic. No cyanosis or erythema. Nails show no clubbing.     Psychiatric: She has a normal mood and affect. Her speech is normal and behavior is normal. Judgment and  thought content normal.     Wt Readings from Last 3 Encounters:  06/28/17 226 lb 3.2 oz (102.6 kg)  06/22/16 228 lb 6.4 oz (103.6 kg)  10/02/13 221 lb (100.2 kg)     Visual Acuity Screening   Right eye Left eye Both eyes  Without correction:     With correction: 20/2 20/20 20/20    BP Readings from Last 3 Encounters:  06/28/17 (!) 138/98  06/22/16 (!) 152/83  05/31/16 137/82        Assessment & Plan:   Problem List Items Addressed This Visit    Varicose veins of bilateral lower extremities with other complications    Severe. Not interested in treatment beyond support stockings.      Relevant Medications   metoprolol succinate (TOPROL-XL) 25 MG 24 hr tablet   PALPITATIONS, RECURRENT    Well-controlled.  No recent palpitations.  Continue metoprolol.      Relevant Medications   metoprolol succinate (TOPROL-XL) 25 MG 24 hr tablet   BMI 37.0-37.9, adult    Support/encourage her efforts for lifestyle changes. Increase exercise to 150 minutes (she plans to get a recumbent bike)      Migraine with aura and without status migrainosus, not intractable    Mild, stable. New headache behind her ears. Not interested in imaging, due to cost.      Relevant Medications   metoprolol succinate (TOPROL-XL) 25 MG 24 hr tablet    Other Visit Diagnoses    Annual physical exam    -  Primary   Age-appropriate health guidance provided.   Need for hepatitis C  screening test       Relevant Orders   Hepatitis C antibody (Completed)   Screening for HIV (human immunodeficiency virus)       Relevant Orders   HIV antibody (Completed)   Screening for cervical cancer       Declines Pap testing today.  She is very low risk and understands her risk is not 0.   Screening for colon cancer       Quite adverse to being put to sleep.  Willing to provide specimen for Cologuard.   Relevant Orders   Cologuard   Encounter for screening mammogram for breast cancer       Screening for deficiency anemia       Relevant Orders   CBC with Differential/Platelet (Completed)   Screening for metabolic disorder       Relevant Orders   Comprehensive metabolic panel (Completed)   Screening for thyroid disorder       Relevant Orders   TSH (Completed)   Immunity status testing       Relevant Orders   Measles/Mumps/Rubella Immunity (Completed)   Screening for hyperlipidemia       Relevant Orders   Lipid panel (Completed)   Screening for blood or protein in urine       Relevant Orders   Urinalysis, dipstick only (Completed)   Routine screening for STI (sexually transmitted infection)       Relevant Orders   GC/Chlamydia Probe Amp   RPR (Completed)   Seasonal allergic rhinitis due to pollen       Relevant Medications   azelastine (ASTELIN) 0.1 % nasal spray   Elevated blood pressure, situational       Monitor.  If remains greater than 140/90 consider increasing metoprolol dose or adding another agent.   Relevant Medications   metoprolol succinate (TOPROL-XL) 25 MG 24 hr tablet  Return in about 1 year (around 06/29/2018) for annual exam.   Fernande Bras, PA-C [facility]

## 2017-06-29 ENCOUNTER — Encounter: Payer: Self-pay | Admitting: Physician Assistant

## 2017-06-29 LAB — COMPREHENSIVE METABOLIC PANEL
ALT: 21 IU/L (ref 0–32)
AST: 16 IU/L (ref 0–40)
Albumin/Globulin Ratio: 1.6 (ref 1.2–2.2)
Albumin: 4.4 g/dL (ref 3.6–4.8)
Alkaline Phosphatase: 84 IU/L (ref 39–117)
BILIRUBIN TOTAL: 0.5 mg/dL (ref 0.0–1.2)
BUN/Creatinine Ratio: 21 (ref 12–28)
BUN: 18 mg/dL (ref 8–27)
CALCIUM: 9.6 mg/dL (ref 8.7–10.3)
CHLORIDE: 101 mmol/L (ref 96–106)
CO2: 23 mmol/L (ref 20–29)
CREATININE: 0.85 mg/dL (ref 0.57–1.00)
GFR, EST AFRICAN AMERICAN: 84 mL/min/{1.73_m2} (ref >59)
GFR, EST NON AFRICAN AMERICAN: 73 mL/min/{1.73_m2} (ref >59)
GLUCOSE: 82 mg/dL (ref 65–99)
Globulin, Total: 2.8 g/dL (ref 1.5–4.5)
Potassium: 4.1 mmol/L (ref 3.5–5.2)
Sodium: 142 mmol/L (ref 134–144)
Total Protein: 7.2 g/dL (ref 6.0–8.5)

## 2017-06-29 LAB — CBC WITH DIFFERENTIAL/PLATELET
BASOS ABS: 0 10*3/uL (ref 0.0–0.2)
Basos: 1 %
EOS (ABSOLUTE): 0.2 10*3/uL (ref 0.0–0.4)
Eos: 3 %
Hematocrit: 46.2 % (ref 34.0–46.6)
Hemoglobin: 15 g/dL (ref 11.1–15.9)
IMMATURE GRANULOCYTES: 0 %
Immature Grans (Abs): 0 10*3/uL (ref 0.0–0.1)
Lymphocytes Absolute: 1.1 10*3/uL (ref 0.7–3.1)
Lymphs: 19 %
MCH: 29.9 pg (ref 26.6–33.0)
MCHC: 32.5 g/dL (ref 31.5–35.7)
MCV: 92 fL (ref 79–97)
MONOCYTES: 10 %
Monocytes Absolute: 0.6 10*3/uL (ref 0.1–0.9)
NEUTROS PCT: 67 %
Neutrophils Absolute: 3.7 10*3/uL (ref 1.4–7.0)
PLATELETS: 327 10*3/uL (ref 150–379)
RBC: 5.02 x10E6/uL (ref 3.77–5.28)
RDW: 14.4 % (ref 12.3–15.4)
WBC: 5.6 10*3/uL (ref 3.4–10.8)

## 2017-06-29 LAB — LIPID PANEL
Chol/HDL Ratio: 4.4 ratio (ref 0.0–4.4)
Cholesterol, Total: 226 mg/dL — ABNORMAL HIGH (ref 100–199)
HDL: 51 mg/dL (ref >39)
LDL Calculated: 141 mg/dL — ABNORMAL HIGH (ref 0–99)
Triglycerides: 171 mg/dL — ABNORMAL HIGH (ref 0–149)
VLDL CHOLESTEROL CAL: 34 mg/dL (ref 5–40)

## 2017-06-29 LAB — URINALYSIS, DIPSTICK ONLY
Bilirubin, UA: NEGATIVE
Glucose, UA: NEGATIVE
KETONES UA: NEGATIVE
LEUKOCYTES UA: NEGATIVE
Nitrite, UA: NEGATIVE
Protein, UA: NEGATIVE
RBC, UA: NEGATIVE
Specific Gravity, UA: 1.019 (ref 1.005–1.030)
Urobilinogen, Ur: 0.2 mg/dL (ref 0.2–1.0)
pH, UA: 5.5 (ref 5.0–7.5)

## 2017-06-29 LAB — MEASLES/MUMPS/RUBELLA IMMUNITY
MUMPS ABS, IGG: 166 [AU]/ml (ref >10.9)
Rubella Antibodies, IGG: 23.1 index (ref >0.99)

## 2017-06-29 LAB — HEPATITIS C ANTIBODY: Hep C Virus Ab: <0.1 s/co ratio (ref 0.0–0.9)

## 2017-06-29 LAB — RPR: RPR Ser Ql: NONREACTIVE

## 2017-06-29 LAB — HIV ANTIBODY (ROUTINE TESTING W REFLEX): HIV Screen 4th Generation wRfx: NONREACTIVE

## 2017-06-29 LAB — TSH: TSH: 2.62 u[IU]/mL (ref 0.450–4.500)

## 2017-06-29 NOTE — Assessment & Plan Note (Signed)
Mild, stable. New headache behind her ears. Not interested in imaging, due to cost.

## 2017-06-29 NOTE — Assessment & Plan Note (Signed)
Well-controlled.  No recent palpitations.  Continue metoprolol.

## 2017-06-29 NOTE — Assessment & Plan Note (Signed)
Severe. Not interested in treatment beyond support stockings.

## 2017-06-29 NOTE — Assessment & Plan Note (Signed)
Support/encourage her efforts for lifestyle changes. Increase exercise to 150 minutes (she plans to get a recumbent bike)

## 2017-06-30 LAB — GC/CHLAMYDIA PROBE AMP
Chlamydia trachomatis, NAA: NEGATIVE
NEISSERIA GONORRHOEAE BY PCR: NEGATIVE

## 2019-04-10 ENCOUNTER — Ambulatory Visit: Payer: BLUE CROSS/BLUE SHIELD

## 2019-12-21 ENCOUNTER — Other Ambulatory Visit: Payer: Self-pay

## 2019-12-21 ENCOUNTER — Emergency Department (HOSPITAL_BASED_OUTPATIENT_CLINIC_OR_DEPARTMENT_OTHER)
Admission: EM | Admit: 2019-12-21 | Discharge: 2019-12-21 | Disposition: A | Discharge status: Home / Self Care | Payer: BC Managed Care – PPO | Attending: Emergency Medicine | Admitting: Emergency Medicine

## 2019-12-21 ENCOUNTER — Encounter (HOSPITAL_BASED_OUTPATIENT_CLINIC_OR_DEPARTMENT_OTHER): Payer: Self-pay

## 2019-12-21 DIAGNOSIS — Z79899 Other long term (current) drug therapy: Secondary | ICD-10-CM | POA: Diagnosis not present

## 2019-12-21 DIAGNOSIS — I10 Essential (primary) hypertension: Secondary | ICD-10-CM | POA: Insufficient documentation

## 2019-12-21 HISTORY — DX: Panic disorder (episodic paroxysmal anxiety): F41.0

## 2019-12-21 HISTORY — DX: Essential (primary) hypertension: I10

## 2019-12-21 NOTE — ED Provider Notes (Signed)
[facility] EMERGENCY DEPARTMENT Provider Note   CSN: [id number] Arrival date & time: 12/21/19  1722     History Chief Complaint  Patient presents with  . Hypertension    Tiffany Lane is a 64 y.o. female w/ hx of anxiety, HTN presenting to ED with elevated blood pressure.  The patient reports she feels she was having a "panick attack" earlier today, with chest tightness, out of body sensation, lightheadedness.  She reports she's under tremendous stress at home dealing with difficult family issues, and did not sleep last night.  She noted her BP was elevated at home > 200 mmhg systolic.  Normally she is well controlled around 120 mmhg systolic at home.  She takes metoprolol at night, and amlodipine 5-10 mg "As needed" for hypertension.  She took 10 mg amlodipine today and also some PRN valium.  Since her arrival in the ED 4 hours ago, she feels better symptomatically.  Denies CP, SOB.  HPI     Past Medical History:  Diagnosis Date  . Acute gallstone pancreatitis 09/22/2013  . Allergy   . Anxiety   . Gallstones 09/22/2013  . Hypertension   . Migraine   . Panic attack     Patient Active Problem List   Diagnosis Date Noted  . Migraine with aura and without status migrainosus, not intractable 06/28/2017  . BMI 37.0-37.9, adult 06/22/2016  . Dermatitis 08/22/2007  . UNSPECIFIED VITAMIN D DEFICIENCY 07/16/2007  . ALLERGIC RHINITIS 06/26/2007  . ADJUSTMENT DISORDER WITH ANXIETY 06/24/2007  . Varicose veins of bilateral lower extremities with other complications 06/24/2007  . NUMBNESS 06/24/2007  . Edema 06/24/2007  . PALPITATIONS, RECURRENT 06/24/2007    Past Surgical History:  Procedure Laterality Date  . CHOLECYSTECTOMY N/A 09/25/2013   Procedure: LAPAROSCOPIC CHOLECYSTECTOMY WITH INTRAOPERATIVE CHOLANGIOGRAM, REPAIR OF UMBILICAL HERNIA;  Surgeon: Kandis Cocking, MD;  Location: [facility];  Service: General;  Laterality: N/A;  . MYRINGOTOMY WITH TUBE PLACEMENT        OB History   No obstetric history on file.     Family History  Problem Relation Age of Onset  . Other Mother 41       cerebral meningioma, brain stem  . Stroke Mother 37       associated with surgery for meningioma of brainstem  . Other Brother 75       benign cerebral meningioma    Social History   Tobacco Use  . Smoking status: Never Smoker  . Smokeless tobacco: Never Used  Vaping Use  . Vaping Use: Never used  Substance Use Topics  . Alcohol use: No  . Drug use: No    Home Medications Prior to Admission medications   Medication Sig Start Date End Date Taking? Authorizing Provider  amLODipine (NORVASC) 5 MG tablet Take 5 mg by mouth daily. 09/08/19   [provider]  azelastine (ASTELIN) 0.1 % nasal spray Place 2 sprays into both nostrils 2 (two) times daily. Use in each nostril as directed 06/28/17   Porfirio Oar, PA  busPIRone (BUSPAR) 15 MG tablet Take 15 mg by mouth 2 (two) times daily. 11/19/19   [provider]  diazepam (VALIUM) 5 MG tablet Take by mouth. 11/19/19   [provider]  metoprolol succinate (TOPROL-XL) 25 MG 24 hr tablet Take 0.5 tablets (12.5 mg total) by mouth at bedtime. 06/28/17   Porfirio Oar, PA  Vitamin D, Ergocalciferol, (DRISDOL) 1.25 MG (50000 UNIT) CAPS capsule Take 50,000 Units by mouth once  a week. 09/17/19   [provider]    Allergies    Adhesive [tape], Blistex [phenol], Iodine, Lip balm [lip medex], and Prednisone  Review of Systems   Review of Systems  Constitutional: Negative for chills and fever.  Eyes: Negative for photophobia and visual disturbance.  Respiratory: Negative for cough and shortness of breath.   Cardiovascular: Negative for chest pain and palpitations.  Gastrointestinal: Negative for abdominal pain and vomiting.  Genitourinary: Negative for dysuria and hematuria.  Musculoskeletal: Negative for arthralgias and myalgias.  Skin: Negative for color change and rash.   Neurological: Positive for light-headedness and headaches. Negative for syncope.  Psychiatric/Behavioral: Negative for agitation. The patient is nervous/anxious.   All other systems reviewed and are negative.   Physical Exam Updated Vital Signs BP (!) 162/82 (BP Location: Right Arm)   Pulse 79   Temp 98.7 F (37.1 C) (Oral)   Resp 14   Ht 5\' 5"  (1.651 m)   Wt 108 kg   SpO2 100%   BMI 39.61 kg/m   Physical Exam Vitals and nursing note reviewed.  Constitutional:      General: She is not in acute distress.    Appearance: She is well-developed.  HENT:     Head: Normocephalic and atraumatic.  Eyes:     Conjunctiva/sclera: Conjunctivae normal.  Cardiovascular:     Rate and Rhythm: Normal rate and regular rhythm.     Pulses: Normal pulses.  Pulmonary:     Effort: Pulmonary effort is normal. No respiratory distress.     Breath sounds: Normal breath sounds.  Musculoskeletal:        General: Normal range of motion.     Cervical back: Neck supple.  Skin:    General: Skin is warm and dry.  Neurological:     General: No focal deficit present.     Mental Status: She is alert and oriented to person, place, and time.     Cranial Nerves: No cranial nerve deficit.     Sensory: No sensory deficit.     ED Results / Procedures / Treatments   Labs (all labs ordered are listed, but only abnormal results are displayed) Labs Reviewed - No data to display  EKG None  Radiology No results found.  Procedures Procedures (including critical care time)  Medications Ordered in ED Medications - No data to display  ED Course  I have reviewed the triage vital signs and the nursing notes.  Pertinent labs & imaging results that were available during my care of the patient were reviewed by me and considered in my medical decision making (see chart for details).  64 yo female, well appearing on exam, here with asymptomatic hypertension.   Her history is clinically highly consistent  with a panic attack today, with similar episodes in the past.  She's on busparone for this and PRN valium, which appears to be helping, but may have had a set-back in the setting of her family stressors (her brother is visiting, who suffers from mental health problems), and lack of sleep.  Her symptoms have improved since arrival.  I have a very low suspicion for stroke, ACS, or hypertensive emergency.    She's on a good BP regimen.  I advised keeping a daily log at home, and using amlodipine titrated as her PCP recommended.  I would not make any other acute changes at this time.  She does use nasal decongestants nightly for at least 1 week - this may be contributing to  her HTN, but she is feeling too miserable to forego them at this time.  Advised PCP f/u tomorrow.  Discharged home in the company of her sister after answering all of their questions.    Final Clinical Impression(s) / ED Diagnoses Final diagnoses:  Hypertension, unspecified type    Rx / DC Orders ED Discharge Orders    None       Alonie Gazzola, Kermit Balo, MD 12/22/19 [phone]

## 2019-12-21 NOTE — Discharge Instructions (Signed)
Follow up with your doctor's office tomorrow.

## 2019-12-21 NOTE — ED Triage Notes (Addendum)
Pt c/o elevated BP since 230pm today-reports hx of compliant HTN -pt took extra amlodipine 5mg  x 2 and 1/4 valium 5mg  tab-pt feels c/o r/t to increase stress/anxiety-denies CP reports "slight HA"-NAD-steady gait
